# Patient Record
Sex: Male | Born: 1940 | Race: White | Hispanic: No | Marital: Married | Smoking: Never smoker
Health system: Southern US, Community
[De-identification: ages and names within clinical notes are randomized; demographics above are authoritative.]

## PROBLEM LIST (undated history)

## (undated) DIAGNOSIS — E785 Hyperlipidemia, unspecified: Secondary | ICD-10-CM

## (undated) DIAGNOSIS — I2510 Atherosclerotic heart disease of native coronary artery without angina pectoris: Secondary | ICD-10-CM

## (undated) DIAGNOSIS — I519 Heart disease, unspecified: Secondary | ICD-10-CM

## (undated) DIAGNOSIS — I4891 Unspecified atrial fibrillation: Secondary | ICD-10-CM

## (undated) DIAGNOSIS — K219 Gastro-esophageal reflux disease without esophagitis: Secondary | ICD-10-CM

## (undated) DIAGNOSIS — J45909 Unspecified asthma, uncomplicated: Secondary | ICD-10-CM

## (undated) DIAGNOSIS — D649 Anemia, unspecified: Secondary | ICD-10-CM

## (undated) DIAGNOSIS — E119 Type 2 diabetes mellitus without complications: Secondary | ICD-10-CM

## (undated) DIAGNOSIS — J449 Chronic obstructive pulmonary disease, unspecified: Secondary | ICD-10-CM

## (undated) HISTORY — DX: Hyperlipidemia, unspecified: E78.5

## (undated) HISTORY — PX: CORONARY ARTERY BYPASS GRAFT: SHX141

## (undated) HISTORY — DX: Gastro-esophageal reflux disease without esophagitis: K21.9

## (undated) HISTORY — DX: Chronic obstructive pulmonary disease, unspecified: J44.9

## (undated) HISTORY — DX: Heart disease, unspecified: I51.9

## (undated) HISTORY — DX: Type 2 diabetes mellitus without complications: E11.9

## (undated) HISTORY — DX: Unspecified asthma, uncomplicated: J45.909

## (undated) HISTORY — DX: Anemia, unspecified: D64.9

## (undated) HISTORY — PX: CARDIAC CATHETERIZATION: SHX172

## (undated) HISTORY — DX: Unspecified atrial fibrillation: I48.91

## (undated) HISTORY — DX: Atherosclerotic heart disease of native coronary artery without angina pectoris: I25.10

## (undated) HISTORY — PX: NECK SURGERY: SHX720

## (undated) HISTORY — PX: WRIST FRACTURE SURGERY: SHX121

---

## 2008-03-07 HISTORY — PX: REPLACEMENT TOTAL HIP W/  RESURFACING IMPLANTS: SUR1222

## 2012-07-02 DIAGNOSIS — J45909 Unspecified asthma, uncomplicated: Secondary | ICD-10-CM | POA: Insufficient documentation

## 2012-07-02 DIAGNOSIS — E291 Testicular hypofunction: Secondary | ICD-10-CM | POA: Insufficient documentation

## 2016-09-13 DIAGNOSIS — M1611 Unilateral primary osteoarthritis, right hip: Secondary | ICD-10-CM | POA: Insufficient documentation

## 2016-09-15 DIAGNOSIS — K920 Hematemesis: Secondary | ICD-10-CM | POA: Insufficient documentation

## 2017-04-10 DIAGNOSIS — N179 Acute kidney failure, unspecified: Secondary | ICD-10-CM | POA: Insufficient documentation

## 2017-04-10 DIAGNOSIS — J101 Influenza due to other identified influenza virus with other respiratory manifestations: Secondary | ICD-10-CM | POA: Insufficient documentation

## 2017-05-04 DIAGNOSIS — I255 Ischemic cardiomyopathy: Secondary | ICD-10-CM | POA: Insufficient documentation

## 2017-05-05 DIAGNOSIS — I5023 Acute on chronic systolic (congestive) heart failure: Secondary | ICD-10-CM | POA: Insufficient documentation

## 2017-11-15 DIAGNOSIS — T8189XA Other complications of procedures, not elsewhere classified, initial encounter: Secondary | ICD-10-CM | POA: Insufficient documentation

## 2017-12-14 DIAGNOSIS — Z951 Presence of aortocoronary bypass graft: Secondary | ICD-10-CM | POA: Insufficient documentation

## 2018-01-30 DIAGNOSIS — S21109A Unspecified open wound of unspecified front wall of thorax without penetration into thoracic cavity, initial encounter: Secondary | ICD-10-CM | POA: Insufficient documentation

## 2018-03-06 DIAGNOSIS — S21109A Unspecified open wound of unspecified front wall of thorax without penetration into thoracic cavity, initial encounter: Secondary | ICD-10-CM | POA: Insufficient documentation

## 2018-10-02 ENCOUNTER — Ambulatory Visit (INDEPENDENT_AMBULATORY_CARE_PROVIDER_SITE_OTHER): Payer: Self-pay | Admitting: Nurse Practitioner

## 2018-10-02 ENCOUNTER — Encounter: Payer: Self-pay | Admitting: Nurse Practitioner

## 2018-10-02 ENCOUNTER — Other Ambulatory Visit: Payer: Self-pay

## 2018-10-02 VITALS — BP 111/46 | HR 91 | Ht 72.5 in | Wt 204.0 lb

## 2018-10-02 DIAGNOSIS — R600 Localized edema: Secondary | ICD-10-CM

## 2018-10-02 DIAGNOSIS — J4520 Mild intermittent asthma, uncomplicated: Secondary | ICD-10-CM

## 2018-10-02 DIAGNOSIS — I2510 Atherosclerotic heart disease of native coronary artery without angina pectoris: Secondary | ICD-10-CM

## 2018-10-02 DIAGNOSIS — E11621 Type 2 diabetes mellitus with foot ulcer: Secondary | ICD-10-CM

## 2018-10-02 DIAGNOSIS — K219 Gastro-esophageal reflux disease without esophagitis: Secondary | ICD-10-CM

## 2018-10-02 DIAGNOSIS — E273 Drug-induced adrenocortical insufficiency: Secondary | ICD-10-CM

## 2018-10-02 DIAGNOSIS — Z7689 Persons encountering health services in other specified circumstances: Secondary | ICD-10-CM

## 2018-10-02 DIAGNOSIS — T380X5A Adverse effect of glucocorticoids and synthetic analogues, initial encounter: Secondary | ICD-10-CM

## 2018-10-02 DIAGNOSIS — L97509 Non-pressure chronic ulcer of other part of unspecified foot with unspecified severity: Secondary | ICD-10-CM

## 2018-10-02 DIAGNOSIS — J410 Simple chronic bronchitis: Secondary | ICD-10-CM

## 2018-10-02 DIAGNOSIS — L03031 Cellulitis of right toe: Secondary | ICD-10-CM

## 2018-10-02 DIAGNOSIS — B351 Tinea unguium: Secondary | ICD-10-CM

## 2018-10-02 DIAGNOSIS — E785 Hyperlipidemia, unspecified: Secondary | ICD-10-CM

## 2018-10-02 MED ORDER — MONTELUKAST SODIUM 10 MG PO TABS: 10.0000 mg | ORAL_TABLET | Freq: Every day | ORAL | 1 refills | Status: DC

## 2018-10-02 MED ORDER — PREDNISONE 10 MG PO TABS: 10.0000 mg | ORAL_TABLET | Freq: Every day | ORAL | 1 refills | Status: AC

## 2018-10-02 MED ORDER — ATORVASTATIN CALCIUM 80 MG PO TABS: 80.0000 mg | ORAL_TABLET | Freq: Every day | ORAL | 1 refills | Status: DC

## 2018-10-02 MED ORDER — SULFAMETHOXAZOLE-TRIMETHOPRIM 400-80 MG PO TABS: 1.0000 | ORAL_TABLET | Freq: Two times a day (BID) | ORAL | 0 refills | Status: AC

## 2018-10-02 MED ORDER — POTASSIUM CHLORIDE CRYS ER 20 MEQ PO TBCR: 40.0000 meq | EXTENDED_RELEASE_TABLET | Freq: Every day | ORAL | 1 refills | Status: DC

## 2018-10-02 MED ORDER — FUROSEMIDE 40 MG PO TABS: 40.0000 mg | ORAL_TABLET | Freq: Every day | ORAL | 1 refills | Status: DC

## 2018-10-02 MED ORDER — ALBUTEROL SULFATE HFA 108 (90 BASE) MCG/ACT IN AERS: 2.0000 | INHALATION_SPRAY | Freq: Four times a day (QID) | RESPIRATORY_TRACT | 11 refills | Status: AC | PRN

## 2018-10-02 MED ORDER — GLIPIZIDE 5 MG PO TABS: 5.0000 mg | ORAL_TABLET | Freq: Two times a day (BID) | ORAL | 1 refills | Status: DC

## 2018-10-02 MED ORDER — PANTOPRAZOLE SODIUM 40 MG PO TBEC: 40.0000 mg | DELAYED_RELEASE_TABLET | Freq: Two times a day (BID) | ORAL | 1 refills | Status: DC

## 2018-10-02 MED ORDER — ONETOUCH DELICA PLUS LANCET30G MISC: 1.0000 | Freq: Two times a day (BID) | 1 refills | Status: AC

## 2018-10-02 MED ORDER — ONETOUCH VERIO VI STRP: ORAL_STRIP | 12 refills | Status: AC

## 2018-10-02 NOTE — Progress Notes (Signed)
Subjective:    Patient ID: Joshua Roth, male    DOB: 1940-04-27, 78 y.o.   MRN: [id number]  Joshua Roth is a 78 y.o. male presenting on 10/02/2018 for Establish Care (possible cellulitis in the Rt lower leg w/ tingling feeling, redness and swelling)   HPI Establish Care New Provider Pt last seen by PCP  Joshua Roth in [place].  He worked at [employer] years recently retired. Kept only Medicare part A.  He finally added Medicare B, hopes to add Medicare part D. He has not yet heard back about his Medicare part B.  Cellulitis Right lower leg has had tingling, redness, swelling for the last 10-14 days.  He reports that he also has associated right paresthesias with neuropathy (burning, electrical shock pain).  He states similar symptoms occurred on left leg in past when he noted a puddle of water on the floor was noted prior wound on LEFT leg.  Then RIGHT leg got very swollen with very taut skin.  He has not had weeping from right leg yet.   Brief history review with patient   Diagnosis Date Noted  . 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  He has been out of his steroid for several days.  Starting to note fatigue.  He takes prednisone daily for last 15-20 years.  10/05/2018  . Type 2 diabetes mellitus with foot ulcer, without Joshua-term current use of insulin ([place]) See wound note above  10/05/2018  . 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   Takes Xarelto but is not sure why other than past CAD.  He has no history of clots or irregular heart rate.  He has been off this medication for several weeks.  Samples were provided to him by his past provider.  10/05/2018  . Simple chronic bronchitis ([facility]) Managed  on maintenance inhalers.  Needs refills.  No significant shortness of breath  10/05/2018  . Mild intermittent asthma without complication Managed on maintenance inhalers.  No significant shortness of breath  10/05/2018  . Onychomycosis Chronic, not currently treating.  Notes his wife provides foot care as he cannot see the bottom of his feet  10/05/2018  . Dyslipidemia Currently managed with statin.  No side effects noted.  No recent ASCVD events and no current symptoms.  10/05/2018  . Gastroesophageal reflux disease Currently managed with ppi.  No side effects noted, no bleeding.  10/05/2018    Past Medical History:  Diagnosis Date  . Anemia   . Asthma   . COPD (chronic obstructive pulmonary disease) ([place])   . Diabetes mellitus without complication ([place])   . GERD (gastroesophageal reflux disease)   . Heart disease   . Hyperlipidemia    Past Surgical History:  Procedure Laterality Date  . 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 Marital status: Married    Spouse name: Not on file  . Number of children: Not on file  . Years of education: Not on file  . Highest education level: Not on file  Occupational History  . Not on file  Social Needs  . Financial resource strain: Not on file  . Food insecurity    Worry: Not  on file    Inability: Not on file  . Transportation needs    Medical: Not on file    Non-medical: Not on file  Tobacco Use  . Smoking status: Never Smoker  . Smokeless tobacco: Never Used  Substance and Sexual Activity  . Alcohol use: Yes    Alcohol/week: 2.0 standard drinks    Types: 2 Cans of beer per week  . Drug use: Never  . Sexual activity: Not on file  Lifestyle  . Physical activity    Days per week: Not on file    Minutes per session: Not on file  . Stress: Not on file  Relationships  . Social Herbalist on phone: Not on file    Gets together: Not on file     Attends religious service: Not on file    Active member of club or organization: Not on file    Attends meetings of clubs or organizations: Not on file    Relationship status: Not on file  . Intimate partner violence    Fear of current or ex partner: Not on file    Emotionally abused: Not on file    Physically abused: Not on file    Forced sexual activity: Not on file  Other Topics Concern  . Not on file  Social History Narrative  . Not on file   Family History  Adopted: Yes   Current Outpatient Medications on File Prior to Visit  Medication Sig  . diclofenac sodium (VOLTAREN) 1 % GEL Apply 2 g topically as needed.  . ferrous sulfate 325 (65 FE) MG EC tablet Take 325 mg by mouth daily.  . Fluticasone-Salmeterol (WIXELA INHUB) 500-50 MCG/DOSE AEPB Inhale 1 puff into the lungs 2 (two) times daily.  . Iron-Vitamins (GERITOL PO) Take by mouth.  . nitroGLYCERIN (NITROSTAT) 0.4 MG SL tablet Place 0.4 mg under the tongue every 5 (five) minutes as needed for chest pain.  . rivaroxaban (XARELTO) 20 MG TABS tablet Take 20 mg by mouth daily with supper.   No current facility-administered medications on file prior to visit.     Review of Systems  Constitutional: Negative for activity change, appetite change, fatigue and unexpected weight change.  HENT: Negative for congestion, hearing loss and trouble swallowing.   Eyes: Negative for visual disturbance.  Respiratory: Positive for shortness of breath and wheezing. Negative for choking.   Cardiovascular: Positive for leg swelling. Negative for chest pain and palpitations.  Gastrointestinal: Negative for abdominal pain, blood in stool, constipation and diarrhea.  Genitourinary: Negative for difficulty urinating, discharge, flank pain, genital sores, penile pain, penile swelling, scrotal swelling and testicular pain.  Musculoskeletal: Negative for arthralgias, back pain and myalgias.  Skin: Positive for rash and wound. Negative for color  change.  Allergic/Immunologic: Negative for environmental allergies.  Neurological: Negative for dizziness, seizures, weakness and headaches.  Psychiatric/Behavioral: Negative for behavioral problems, decreased concentration, dysphoric mood, sleep disturbance and suicidal ideas. The patient is not nervous/anxious.    Per HPI unless specifically indicated above     Objective:    BP (!) 111/46 (BP Location: Left Arm, Patient Position: Sitting, Cuff Size: Normal)   Pulse 91   Ht 6' 0.5" (1.842 m)   Wt 204 lb (92.5 kg)   BMI 27.29 kg/m   Wt Readings from Last 3 Encounters:  10/02/18 204 lb (92.5 kg)    Physical Exam Vitals signs reviewed.  Constitutional:      General: He is not in acute  distress.    Appearance: He is well-developed.  HENT:     Head: Normocephalic and atraumatic.  Cardiovascular:     Rate and Rhythm: Normal rate and regular rhythm.     Pulses:          Radial pulses are 2+ on the right side and 2+ on the left side.       Posterior tibial pulses are 1+ on the right side and 1+ on the left side.     Heart sounds: Normal heart sounds, S1 normal and S2 normal.  Pulmonary:     Effort: Pulmonary effort is normal. No respiratory distress.     Breath sounds: Normal breath sounds and air entry.  Musculoskeletal:     Right lower leg: No edema.     Left lower leg: No edema.       Feet:  Skin:    General: Skin is warm and dry.     Capillary Refill: Capillary refill takes less than 2 seconds.  Neurological:     General: No focal deficit present.     Mental Status: He is alert and oriented to person, place, and time.  Psychiatric:        Attention and Perception: Attention normal.        Mood and Affect: Mood and affect normal.        Behavior: Behavior normal. Behavior is cooperative.        Thought Content: Thought content normal.        Judgment: Judgment normal.         Assessment & Plan:   Problem List Items Addressed This Visit      Cardiovascular and  Mediastinum   Coronary artery disease involving native coronary artery of native heart without angina pectoris Status unknown.  Continue medications at this time.  FOLLOW-UP in more detail in 6-8 weeks.   Relevant Medications   nitroGLYCERIN (NITROSTAT) 0.4 MG SL tablet   rivaroxaban (XARELTO) 20 MG TABS tablet   atorvastatin (LIPITOR) 80 MG tablet   furosemide (LASIX) 40 MG tablet     Respiratory   Simple chronic bronchitis (HCC) Lung function status unknown. Currently without exacerbation.  Continue medications at this time.  FOLLOW-UP in more detail in 6-8 weeks.   Relevant Medications   albuterol (VENTOLIN HFA) 108 (90 Base) MCG/ACT inhaler   Mild intermittent asthma without complication See COPD above   Relevant Medications   Fluticasone-Salmeterol (WIXELA INHUB) 500-50 MCG/DOSE AEPB   albuterol (VENTOLIN HFA) 108 (90 Base) MCG/ACT inhaler   montelukast (SINGULAIR) 10 MG tablet   predniSONE (DELTASONE) 10 MG tablet     Digestive   Gastroesophageal reflux disease Status unknown.  Continue medications at this time.  FOLLOW-UP in more detail in 6-8 weeks.   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 Need to obtain medical records for better history.  Continue medications at this time.  FOLLOW-UP in more detail in 6-8 weeks.   Relevant Medications   predniSONE (DELTASONE) 10 MG tablet   Type 2 diabetes mellitus with foot ulcer, without Joshua-term current use of insulin (HCC) Currently active ulcer.  DM status unknown.  Continue current meds - get labs and evaluate today.  Follow-up 6-8 weeks.   Relevant Medications   atorvastatin (LIPITOR) 80 MG tablet   glipiZIDE (GLUCOTROL) 5 MG tablet   glucose blood (ONETOUCH VERIO) test strip   Lancets [facility]  DELICA PLUS [id number]) MISC     Musculoskeletal and Integument   Onychomycosis Present on all nails of feet.  No  current action needed other than using athlete's foot OTC spray/cream, but recommend referral to podiatry.      Other   Pedal edema See cellulitis.  Patient regularly takes furosemide for fluid management.  Continue today. Check labs. Follow-up 3 months.   Relevant Medications   furosemide (LASIX) 40 MG tablet   potassium chloride SA (K-DUR) 20 MEQ tablet   Cellulitis of toe of right foot - Primary Patient with cellulitis of 3rd toe on right foot with involvement of lower RIGHT leg.  Toe wound is likely source of infection.  Needs referral to podiatry.  START Bactrim course x 14 days.  Call if side effects occur.  Keep regular foot care.  Follow-up 2-4 weeks prn.  Also adding CCM referral for assistance with medicare applications as this problem needs to be prevented with regular care in future.    Relevant Medications   sulfamethoxazole-trimethoprim (BACTRIM) 400-80 MG tablet   Dyslipidemia Status unknown.  Continue medications at this time.  FOLLOW-UP in more detail in 6-8 weeks.   Relevant Medications   atorvastatin (LIPITOR) 80 MG tablet    Other Visit Diagnoses    Encounter to establish care     Patient with complex medical history and poor ability to provide details today.  Will need to request records from past provider.  Reviewed history today as above.      Meds ordered this encounter  Medications  . albuterol (VENTOLIN HFA) 108 (90 Base) MCG/ACT inhaler    Sig: Inhale 2 puffs into the lungs every 6 (six) hours as needed for wheezing or shortness of breath.    Dispense:  8 g    Refill:  11    Order Specific Question:   Supervising Provider    Answer:   Olin Hauser [[id number]]  . atorvastatin (LIPITOR) 80 MG tablet    Sig: Take 1 tablet (80 mg total) by mouth daily.    Dispense:  90 tablet    Refill:  1    Order Specific Question:   Supervising Provider    Answer:   Olin Hauser [[id number]]  . furosemide (LASIX) 40 MG tablet    Sig: Take 1 tablet (40 mg  total) by mouth daily.    Dispense:  90 tablet    Refill:  1    Order Specific Question:   Supervising Provider    Answer:   Olin Hauser [[id number]]  . glipiZIDE (GLUCOTROL) 5 MG tablet    Sig: Take 1 tablet (5 mg total) by mouth 2 (two) times daily before a meal.    Dispense:  90 tablet    Refill:  1    Order Specific Question:   Supervising Provider    Answer:   Olin Hauser [[id number]]  . glucose blood (ONETOUCH VERIO) test strip    Sig: Use as instructed    Dispense:  100 each    Refill:  12    Hold prescription on file and fill when requested    Order Specific Question:   Supervising Provider    Answer:   Olin Hauser [[id number]]  . Lancets (ONETOUCH DELICA PLUS [id number]) MISC    Sig: 1 Device by Does not apply route 2 (two) times a day.    Dispense:  200 each    Refill:  1    Hold prescription on file  and fill when requested    Order Specific Question:   Supervising Provider    Answer:   Olin Hauser [[id number]]  . montelukast (SINGULAIR) 10 MG tablet    Sig: Take 1 tablet (10 mg total) by mouth at bedtime.    Dispense:  90 tablet    Refill:  1    Order Specific Question:   Supervising Provider    Answer:   Olin Hauser [[id number]]  . pantoprazole (PROTONIX) 40 MG tablet    Sig: Take 1 tablet (40 mg total) by mouth 2 (two) times daily.    Dispense:  90 tablet    Refill:  1    Order Specific Question:   Supervising Provider    Answer:   Olin Hauser [[id number]]  . potassium chloride SA (K-DUR) 20 MEQ tablet    Sig: Take 2 tablets (40 mEq total) by mouth daily.    Dispense:  180 tablet    Refill:  1    Order Specific Question:   Supervising Provider    Answer:   Olin Hauser [[id number]]  . predniSONE (DELTASONE) 10 MG tablet    Sig: Take 1 tablet (10 mg total) by mouth daily with breakfast.    Dispense:  180 tablet    Refill:  1    Order Specific Question:   Supervising Provider    Answer:   Olin Hauser [[id number]]  . sulfamethoxazole-trimethoprim (BACTRIM) 400-80 MG tablet    Sig: Take 1 tablet by mouth 2 (two) times daily for 14 days.    Dispense:  28 tablet    Refill:  0    Order Specific Question:   Supervising Provider    Answer:   Olin Hauser [[id number]]   Follow up plan: Return in about 2 weeks (around 10/16/2018) for telephone visit for cellulitis AND in 3 months for diabetes, COPD.  [name], DNP, AGPCNP-BC Adult Gerontology Primary Care Nurse Practitioner [facility] 10/02/2018, 10:57 AM

## 2018-10-02 NOTE — Patient Instructions (Addendum)
Joshua Roth,   Thank you for coming in to clinic today.  1. Continue all current medications without changes.  2. Your Right leg has cellulitis because of your Right third toe infection. START Bactrim 400-80 mg one tablet twice daily for 14 days.  3. You also have a fungus on your skin on the Right leg. - Start athlete's foot spray (Lotrimin or similar medicine) or cream twice daily until this is gone.    4. Podiatry referral is being made for foot care.  They will call you to make the appointment. [facility] [address] [place], [place] [address] Phone: [phone] Fax: [phone]  Please schedule a follow-up appointment with [name], AGNP. Return in about 2 weeks (around 10/16/2018) for telephone visit for cellulitis AND in 3 months for diabetes, COPD.  If you have any other questions or concerns, please feel free to call the clinic or send a message through [place]. You may also schedule an earlier appointment if necessary.  You will receive a survey after today's visit either digitally by e-mail or paper by [employer]. Your experiences and feedback matter to [place].  Please respond so we know how we are doing as we provide care for you.   [name], DNP, AGNP-BC Adult Gerontology Nurse Practitioner [place]

## 2018-10-05 ENCOUNTER — Encounter: Payer: Self-pay | Admitting: Nurse Practitioner

## 2018-10-05 DIAGNOSIS — J410 Simple chronic bronchitis: Secondary | ICD-10-CM | POA: Insufficient documentation

## 2018-10-05 DIAGNOSIS — I2510 Atherosclerotic heart disease of native coronary artery without angina pectoris: Secondary | ICD-10-CM | POA: Insufficient documentation

## 2018-10-05 DIAGNOSIS — K219 Gastro-esophageal reflux disease without esophagitis: Secondary | ICD-10-CM | POA: Insufficient documentation

## 2018-10-05 DIAGNOSIS — J4520 Mild intermittent asthma, uncomplicated: Secondary | ICD-10-CM | POA: Insufficient documentation

## 2018-10-05 DIAGNOSIS — E785 Hyperlipidemia, unspecified: Secondary | ICD-10-CM | POA: Insufficient documentation

## 2018-10-05 DIAGNOSIS — E11621 Type 2 diabetes mellitus with foot ulcer: Secondary | ICD-10-CM | POA: Insufficient documentation

## 2018-10-05 DIAGNOSIS — E273 Drug-induced adrenocortical insufficiency: Secondary | ICD-10-CM | POA: Insufficient documentation

## 2018-10-05 DIAGNOSIS — B351 Tinea unguium: Secondary | ICD-10-CM | POA: Insufficient documentation

## 2018-10-05 DIAGNOSIS — R600 Localized edema: Secondary | ICD-10-CM | POA: Insufficient documentation

## 2018-10-05 DIAGNOSIS — T380X5A Adverse effect of glucocorticoids and synthetic analogues, initial encounter: Secondary | ICD-10-CM | POA: Insufficient documentation

## 2018-10-05 DIAGNOSIS — L03031 Cellulitis of right toe: Secondary | ICD-10-CM | POA: Insufficient documentation

## 2018-10-05 DIAGNOSIS — L97509 Non-pressure chronic ulcer of other part of unspecified foot with unspecified severity: Secondary | ICD-10-CM | POA: Insufficient documentation

## 2018-10-16 ENCOUNTER — Ambulatory Visit (INDEPENDENT_AMBULATORY_CARE_PROVIDER_SITE_OTHER): Payer: Self-pay | Admitting: Nurse Practitioner

## 2018-10-16 ENCOUNTER — Other Ambulatory Visit: Payer: Self-pay

## 2018-10-16 ENCOUNTER — Encounter: Payer: Self-pay | Admitting: Nurse Practitioner

## 2018-10-16 DIAGNOSIS — L03031 Cellulitis of right toe: Secondary | ICD-10-CM

## 2018-10-16 NOTE — Progress Notes (Signed)
   Telemedicine Encounter: Disclosed to patient at start of encounter that we will provide appropriate telemedicine services.  Patient consents to be treated via phone prior to discussion. - Patient is at his home and is accessed via telephone. - Services are provided by [name] from [facility].   Subjective:    Patient ID: Joshua Roth, male    DOB: 08/27/1940, 78 y.o.   MRN: [id number]  Joshua Roth is a 78 y.o. male presenting on 10/16/2018 for Recurrent Skin Infections (pt finished abx today and notice improvement with swelling, but unsure if its related to abx or just him elevation them. He still notice bilateral erythema . )  HPI Cellulitis follow-up Patient notes he took the last pill of his Bactrim today.  Patient still has redness from ankle and "14-15 inches up leg."  Swelling is improving.  More swelling again with dependent positions. Will continue to get hardened and tight with swelling as the day progresses.  - Patient is unable to see the bottom of his foot, but has been using a mirror.  His wife helps him care for his feet. - She describes the 3rd toe today as continuing to be hard and crusty, Is still dark brown central wound with a little red around the edges.  Toe swelling and redness has improved some. - Patient does have approval for Part B effective in July 2020.  Social History   Tobacco Use  . Smoking status: Never Smoker  . Smokeless tobacco: Never Used  Substance Use Topics  . Alcohol use: Yes    Alcohol/week: 2.0 standard drinks    Types: 2 Cans of beer per week  . Drug use: Never    Review of Systems Per HPI unless specifically indicated above     Objective:    There were no vitals taken for this visit.  Wt Readings from Last 3 Encounters:  10/02/18 204 lb (92.5 kg)    Physical Exam Patient remotely monitored.  Verbal communication appropriate.  Cognition normal.  Verbal description of wound sounds relatively unchanged  from initial exam. No results found for this or any previous visit.    Assessment & Plan:   Problem List Items Addressed This Visit      Other   Cellulitis of toe of right foot - Primary   Relevant Orders   Ambulatory referral to Wound Clinic    Remote evaluation limits assessment of wound, but description indicates limited improvement.  Patient's wound healing complicated by uncontrolled T2DM, likely poor circulation.  Plan: 1. Referral to wound clinic.   2. No repeat abx course at this time.   3. Reviewed signs and symptoms of worsening wound infection and when to call clinic.  Patient and his wife verbalize understanding. 4. Follow-up 2-4 weeks prn.  - Time spent in direct consultation with patient via telemedicine about above concerns: 7 minutes  Follow up plan: Follow-up 2-4 weeks prn and in about 1 month for DM assessment.  [name], DNP, AGPCNP-BC Adult Gerontology Primary Care Nurse Practitioner [facility] 10/16/2018, 11:14 AM

## 2018-10-18 ENCOUNTER — Other Ambulatory Visit: Payer: Self-pay

## 2018-10-18 ENCOUNTER — Encounter: Payer: Medicare Other | Attending: Physician Assistant | Admitting: Physician Assistant

## 2018-10-18 ENCOUNTER — Encounter: Payer: Self-pay | Admitting: Nurse Practitioner

## 2018-10-18 DIAGNOSIS — I252 Old myocardial infarction: Secondary | ICD-10-CM | POA: Insufficient documentation

## 2018-10-18 DIAGNOSIS — I482 Chronic atrial fibrillation, unspecified: Secondary | ICD-10-CM | POA: Insufficient documentation

## 2018-10-18 DIAGNOSIS — E1140 Type 2 diabetes mellitus with diabetic neuropathy, unspecified: Secondary | ICD-10-CM | POA: Insufficient documentation

## 2018-10-18 DIAGNOSIS — I2510 Atherosclerotic heart disease of native coronary artery without angina pectoris: Secondary | ICD-10-CM | POA: Diagnosis not present

## 2018-10-18 DIAGNOSIS — Z7984 Long term (current) use of oral hypoglycemic drugs: Secondary | ICD-10-CM | POA: Insufficient documentation

## 2018-10-18 DIAGNOSIS — L84 Corns and callosities: Secondary | ICD-10-CM | POA: Diagnosis not present

## 2018-10-18 DIAGNOSIS — I10 Essential (primary) hypertension: Secondary | ICD-10-CM | POA: Diagnosis not present

## 2018-10-18 DIAGNOSIS — E119 Type 2 diabetes mellitus without complications: Secondary | ICD-10-CM | POA: Insufficient documentation

## 2018-10-18 DIAGNOSIS — Z7901 Long term (current) use of anticoagulants: Secondary | ICD-10-CM | POA: Insufficient documentation

## 2018-10-18 NOTE — Progress Notes (Signed)
AZTLAN, COLL ([id number]) Visit Report for 10/18/2018 Abuse/Suicide Risk Screen Details Patient Name: Joshua Roth, Joshua Roth Date of Service: 10/18/2018 2:15 PM Medical Record Number: [id number] Patient Account Number: [id number] Date of Birth/Sex: 08-Oct-1940 (78 y.o. M) Treating RN: Montey Hora Primary Care Ghassan Coggeshall: [name] Other Clinician: Referring Awilda Covin: [name] Treating Athalene Kolle/Extender: Melburn Hake, HOYT Weeks in Treatment: 0 Abuse/Suicide Risk Screen Items Answer ABUSE RISK SCREEN: Has anyone close to you tried to hurt or harm you recentlyo No Do you feel uncomfortable with anyone in your familyo No Has anyone forced you do things that you didnot want to doo No Electronic Signature(s) Signed: 10/18/2018 4:45:21 PM By: Montey Hora Entered By: Montey Hora on 10/18/2018 14:51:34 Joshua Roth ([id number]) -------------------------------------------------------------------------------- Activities of Daily Living Details Patient Name: Joshua Roth Date of Service: 10/18/2018 2:15 PM Medical Record Number: [id number] Patient Account Number: [id number] Date of Birth/Sex: September 19, 1940 (78 y.o. M) Treating RN: Montey Hora Primary Care Mehkai Gallo: [name] Other Clinician: Referring Jerilynn Feldmeier: [name] Treating Chaise Mahabir/Extender: Melburn Hake, HOYT Weeks in Treatment: 0 Activities of Daily Living Items Answer Activities of Daily Living (Please select one for each item) Drive Automobile Completely Able Take Medications Completely Able Use Telephone Completely Able Care for Appearance Completely Able Use Toilet Completely Able Bath / Shower Completely Able Dress Self Completely Able Feed Self Completely Able Walk Completely Able Get In / Out Bed Completely Able Housework Completely Able Prepare Meals Completely [place] for Self Completely Able Electronic Signature(s) Signed: 10/18/2018 4:45:21 PM By: Montey Hora Entered By: Montey Hora on 10/18/2018 14:51:54 Joshua Roth ([id number]) -------------------------------------------------------------------------------- Education Screening Details Patient Name: Joshua Roth Date of Service: 10/18/2018 2:15 PM Medical Record Number: [id number] Patient Account Number: [id number] Date of Birth/Sex: [birth year] (78 y.o. M) Treating RN: Montey Hora Primary Care Mehkai Gallo: [name] Other Clinician: Referring Tamma Brigandi: [name] Treating Knox Holdman/Extender: Melburn Hake, HOYT Weeks in Treatment: 0 Primary Learner Assessed: Patient Learning Preferences/Education Level/Primary Language Learning Preference: Explanation, Demonstration Highest Education Level: College or Above Preferred Language: English Cognitive Barrier Language Barrier: No Translator Needed: No Memory Deficit: No Emotional Barrier: No Cultural/Religious Beliefs Affecting Medical Care: No Physical Barrier Impaired Vision: No Impaired Hearing: No Decreased Hand dexterity: No Knowledge/Comprehension Knowledge Level: Medium Comprehension Level: Medium Ability to understand written Medium instructions: Ability to understand verbal Medium instructions: Motivation Anxiety Level: Calm Cooperation: Cooperative Education Importance: Acknowledges Need Interest in Health Problems: Asks Questions Perception: Coherent Willingness to Engage in Self- Medium Management Activities: Readiness to Engage in Self- Medium Management Activities: Electronic Signature(s) Signed: 10/18/2018 4:45:21 PM By: Montey Hora Entered By: Montey Hora on 10/18/2018 14:52:21 Joshua Roth ([id number]) -------------------------------------------------------------------------------- Fall Risk Assessment Details Patient Name: Joshua Roth Date of Service: 10/18/2018 2:15 PM Medical Record Number: [id number] Patient Account Number: [id number] Date of Birth/Sex: [birth year]  (78 y.o. M) Treating RN: Montey Hora Primary Care Scarlet Abad: [name] Other Clinician: Referring Leathia Farnell: [name] Treating Guelda Batson/Extender: Melburn Hake, HOYT Weeks in Treatment: 0 Fall Risk Assessment Items Have you had 2 or more falls in the last 12 monthso 0 No Have you had any fall that resulted in injury in the last 12 monthso 0 No FALLS RISK SCREEN History of falling - immediate or within 3 months 0 No Secondary diagnosis (Do you have 2 or more medical diagnoseso) 0 No Ambulatory aid None/bed rest/wheelchair/nurse 0 Yes Crutches/cane/walker 0 No Furniture 0 No Intravenous therapy Access/Saline/Heparin Lock 0 No Gait/Transferring Normal/ bed rest/ wheelchair 0 Yes Weak (short steps with or without shuffle, stooped  but able to lift head while 0 No walking, may seek support from furniture) Impaired (short steps with shuffle, may have difficulty arising from chair, head 0 No down, impaired balance) Mental Status Oriented to own ability 0 Yes Notes had a fall 7 months ago Electronic Signature(s) Signed: 10/18/2018 4:45:21 PM By: Montey Hora Entered By: Montey Hora on 10/18/2018 14:53:40 Joshua Roth ([id number]) -------------------------------------------------------------------------------- Foot Assessment Details Patient Name: Joshua Roth Date of Service: 10/18/2018 2:15 PM Medical Record Number: [id number] Patient Account Number: [id number] Date of Birth/Sex: [birth year] (78 y.o. M) Treating RN: Montey Hora Primary Care Thelmer Legler: [name] Other Clinician: Referring Marlies Ligman: [name] Treating Chaselyn Nanney/Extender: Melburn Hake, HOYT Weeks in Treatment: 0 Foot Assessment Items Site Locations + = Sensation present, - = Sensation absent, C = Callus, U = Ulcer R = Redness, W = Warmth, M = Maceration, PU = Pre-ulcerative lesion F = Fissure, S = Swelling, D = Dryness Assessment Right: Left: Other Deformity: No No Prior Foot  Ulcer: No No Prior Amputation: No No Charcot Joint: No No Ambulatory Status: Ambulatory Without Help Gait: Steady Electronic Signature(s) Signed: 10/18/2018 4:45:21 PM By: Montey Hora Entered By: Montey Hora on 10/18/2018 14:56:45 Joshua Roth ([id number]) -------------------------------------------------------------------------------- Nutrition Risk Screening Details Patient Name: Joshua Roth Date of Service: 10/18/2018 2:15 PM Medical Record Number: [id number] Patient Account Number: [id number] Date of Birth/Sex: [birth year] (78 y.o. M) Treating RN: Montey Hora Primary Care Tene Gato: [name] Other Clinician: Referring Paris Hohn: [name] Treating Wilho Sharpley/Extender: Melburn Hake, HOYT Weeks in Treatment: 0 Height (in): 73 Weight (lbs): 205 Body Mass Index (BMI): 27 Nutrition Risk Screening Items Score Screening NUTRITION RISK SCREEN: I have an illness or condition that made me change the kind and/or amount of 0 No food I eat I eat fewer than two meals per day 0 No I eat few fruits and vegetables, or milk products 0 No I have three or more drinks of beer, liquor or wine almost every day 0 No I have tooth or mouth problems that make it hard for me to eat 0 No I don't always have enough money to buy the food I need 0 No I eat alone most of the time 0 No I take three or more different prescribed or over-the-counter drugs a day 1 Yes Without wanting to, I have lost or gained 10 pounds in the last six months 0 No I am not always physically able to shop, cook and/or feed myself 0 No Nutrition Protocols Good Risk Protocol 0 No interventions needed Moderate Risk Protocol High Risk Proctocol Risk Level: Good Risk Score: 1 Electronic Signature(s) Signed: 10/18/2018 4:45:21 PM By: Montey Hora Entered By: Montey Hora on 10/18/2018 14:53:47

## 2018-10-18 NOTE — Progress Notes (Addendum)
JONES, VIVIANI ([id number]) Visit Report for 10/18/2018 Allergy List Details Patient Name: MELTON, WALLS Date of Service: 10/18/2018 2:15 PM Medical Record Number: [id number] Patient Account Number: [id number] Date of Birth/Sex: August 09, 1940 (78 y.o. M) Treating RN: Montey Hora Primary Care Bilaal Leib: [name] Other Clinician: Referring Jakson Delpilar: [name] Treating Jerilee Space/Extender: STONE III, HOYT Weeks in Treatment: 0 Allergies Active Allergies No Known Drug Allergies Allergy Notes Electronic Signature(s) Signed: 10/18/2018 4:45:21 PM By: Montey Hora Entered By: Montey Hora on 10/18/2018 14:47:15 [name] ([id number]) -------------------------------------------------------------------------------- Arrival Information Details Patient Name: [name] Date of Service: 10/18/2018 2:15 PM Medical Record Number: [id number] Patient Account Number: [id number] Date of Birth/Sex: 12/05/1940 (78 y.o. 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 M) Treating RN: Army Melia Primary Care Harjit Douds: [name] Other  Clinician: Referring Shakeya Kerkman: [name] Treating Minka Knight/Extender: Melburn Hake, HOYT Weeks in Treatment: 0 Clinic Level of Care Assessment Items TOOL 4 Quantity Score []  - Use when only an EandM is performed on FOLLOW-UP visit 0 ASSESSMENTS - Nursing Assessment / Reassessment X - Reassessment of Co-morbidities (includes updates in patient status) 1 10 X- 1 5 Reassessment of Adherence to Treatment Plan ASSESSMENTS - Wound and Skin Assessment / Reassessment X - Simple Wound Assessment / Reassessment - one wound 1 5 []  - 0 Complex Wound Assessment / Reassessment - multiple wounds []  - 0 Dermatologic / Skin Assessment (not related to wound area) ASSESSMENTS - Focused Assessment []  - Circumferential Edema Measurements - multi extremities 0 []  - 0 Nutritional Assessment / Counseling / Intervention []  - 0 Lower Extremity Assessment (monofilament, tuning fork, pulses) []  - 0 Peripheral Arterial Disease Assessment (using hand held doppler) ASSESSMENTS - Ostomy and/or Continence Assessment and Care []  - Incontinence Assessment and Management 0 []  - 0 Ostomy Care Assessment and Management (repouching, etc.) PROCESS - Coordination of Care X - Simple Patient / Family Education for ongoing care 1 15 []  - 0 Complex (extensive) Patient / Family Education for ongoing care X- 1 10 Staff obtains Programmer, systems, Records, Test Results / Process Orders []  - 0 Staff telephones HHA, Nursing Homes / Clarify orders / etc []  - 0 Routine Transfer to another Facility (non-emergent condition) []  - 0 Routine Hospital Admission (non-emergent condition) X- 1 15 New Admissions / Biomedical engineer / Ordering NPWT, Apligraf, etc. []  - 0 Emergency Hospital Admission (emergent condition) X- 1 10 Simple Discharge Coordination OLAND, ARQUETTE ([id number]) []  - 0 Complex (extensive) Discharge Coordination PROCESS - Special Needs []  - Pediatric / Minor Patient Management 0 []  - 0 Isolation Patient  Management []  - 0 Hearing / Language / Visual special needs []  - 0 Assessment of Community assistance (transportation, D/C planning, etc.) []  - 0 Additional assistance / Altered mentation []  - 0 Support Surface(s) Assessment (bed, cushion, seat, etc.) INTERVENTIONS - Wound Cleansing / Measurement X - Simple Wound Cleansing - one wound 1 5 []  - 0 Complex Wound Cleansing - multiple  wounds []  - 0 Wound Imaging (photographs - any number of wounds) []  - 0 Wound Tracing (instead of photographs) []  - 0 Simple Wound Measurement - one wound []  - 0 Complex Wound Measurement - multiple wounds INTERVENTIONS - Wound Dressings []  - Small Wound Dressing one or multiple wounds 0 []  - 0 Medium Wound Dressing one or multiple wounds []  - 0 Large Wound Dressing one or multiple wounds []  - 0 Application of Medications - topical []  - 0 Application of Medications - injection INTERVENTIONS - Miscellaneous []  - External ear exam 0 []  - 0 Specimen Collection (cultures, biopsies, blood, body fluids, etc.) []  - 0 Specimen(s) / Culture(s) sent or taken to Lab for analysis []  - 0 Patient Transfer (multiple staff / Civil Service fast streamer / Similar devices) []  - 0 Simple Staple / Suture removal (25 or less) []  - 0 Complex Staple / Suture removal (26 or more) []  - 0 Hypo / Hyperglycemic Management (close monitor of Blood Glucose) []  - 0 Ankle / Brachial Index (ABI) - do not check if billed separately X- 1 5 Vital Signs Pardo, Benjimen ([id number]) Has the patient been seen at the hospital within the last three years: Yes Total Score: 80 Level Of Care: New/Established - Level 3 Electronic Signature(s) Signed: 10/18/2018 4:39:13 PM By: Army Melia Entered By: Army Melia on 10/18/2018 15:19:47 [name] ([id number]) -------------------------------------------------------------------------------- Encounter Discharge Information Details Patient Name: [name] Date of Service: 10/18/2018 2:15  PM Medical Record Number: [id number] Patient Account Number: [id number] Date of Birth/Sex: Jan 14, 1941 (78 y.o. M) Treating RN: Army Melia Primary Care Amantha Sklar: [name] Other Clinician: Referring Salimah Martinovich: [name] Treating Lisle Skillman/Extender: Melburn Hake, HOYT Weeks in Treatment: 0 Encounter Discharge Information Items Discharge Condition: Stable Ambulatory Status: Ambulatory Discharge Destination: Home Transportation: Private Auto Accompanied By: self Schedule Follow-up Appointment: Yes Clinical Summary of Care: Electronic Signature(s) Signed: 10/18/2018 4:39:13 PM By: Army Melia Entered By: Army Melia on 10/18/2018 15:20:06 [name] ([id number]) -------------------------------------------------------------------------------- Lower Extremity Assessment Details Patient Name: [name] Date of Service: 10/18/2018 2:15 PM Medical Record Number: [id number] Patient Account Number: [id number] Date of Birth/Sex: 27-May-1940 (77 y.o. M) Treating RN: Montey Hora Primary Care Domonic Hiscox: [name] Other Clinician: Referring Apurva Reily: [name] Treating Demarie Hyneman/Extender: Melburn Hake, HOYT Weeks in Treatment: 0 Edema Assessment Assessed: [Left: No] [Right: No] Edema: [Left: No] [Right: Yes] Vascular Assessment Pulses: Dorsalis Pedis Palpable: [Left:Yes] [Right:Yes] Doppler Audible: [Left:Yes] [Right:Yes] Posterior Tibial Palpable: [Left:Yes] [Right:Yes] Doppler Audible: [Left:Yes] [Right:Yes] Blood Pressure: Brachial: [Left:130] Dorsalis Pedis: 136 [Left:Dorsalis Pedis: 623] Ankle: Posterior Tibial: 132 [Left:Posterior Tibial: 128 1.05] [Right:1.09] Electronic Signature(s) Signed: 10/18/2018 4:45:21 PM By: Montey Hora Entered By: Montey Hora on 10/18/2018 15:06:28 [name] ([id number]) -------------------------------------------------------------------------------- Multi Wound Chart Details Patient Name: [name] Date of Service: 10/18/2018 2:15 PM Medical Record Number: [id number] Patient Account Number: [id number] Date of Birth/Sex: April 27, 1940 (77 y.o. M) Treating RN: Army Melia Primary Care Catcher Dehoyos: [name] Other Clinician: Referring Mckynleigh Mussell: [name] Treating Jayshun Galentine/Extender: Melburn Hake, HOYT Weeks in Treatment: 0 Vital Signs Height(in): 73 Pulse(bpm): 86 Weight(lbs): 205 Blood Pressure(mmHg): 130/84 Body Mass Index(BMI): 27 Temperature(F): 98.8 Respiratory Rate 16 (breaths/min): Wound Assessments Treatment Notes Electronic Signature(s) Signed: 10/18/2018 4:39:13 PM By: Army Melia Entered By: Army Melia on 10/18/2018 15:17:41 [name] ([id number]) -------------------------------------------------------------------------------- Multi-Disciplinary Care Plan Details Patient Name: [name] Date of Service: 10/18/2018 2:15 PM Medical Record Number: [id number] Patient Account Number: [id number] Date of Birth/Sex: 03-12-1940 (77 y.o. M) Treating RN: Army Melia Primary Care Kaisei Gilbo: [name] Other Clinician:  Referring Alexiah Koroma: [name] Treating Lealer Marsland/Extender: Melburn Hake, HOYT Weeks in Treatment: 0 Active Inactive Electronic Signature(s) Signed: 10/18/2018 4:39:13 PM By: Army Melia Entered By: Army Melia on 10/18/2018 15:17:33 [name] ([id number]) -------------------------------------------------------------------------------- Non-Wound Condition Assessment Details Patient Name: [name] Date of Service: 10/18/2018 2:15 PM Medical Record Number: [id number] Patient Account Number: [id number] Date of Birth/Sex: 13-Mar-1940 (77 y.o. M) Treating RN: Montey Hora Primary Care Kysa Calais: [name] Other Clinician: Referring Myrian Botello: [name] Treating Aranza Geddes/Extender: Melburn Hake, HOYT Weeks in Treatment: 0 Non-Wound Condition: Condition: Other Dermatologic Condition Location: Foot Side:  Right Photos Notes patient with a callus on his right 3rd toe Electronic Signature(s) Signed: 10/18/2018 4:45:21 PM By: Montey Hora Entered By: Montey Hora on 10/18/2018 14:58:38 [name] ([id number]) -------------------------------------------------------------------------------- Pain Assessment Details Patient Name: [name] Date of Service: 10/18/2018 2:15 PM Medical Record Number: [id number] Patient Account Number: [id number] Date of Birth/Sex: 1941/01/21 (77 y.o. M) Treating RN: Montey Hora Primary Care Kaylan Yates: [name] Other Clinician: Referring Titiana Severa: [name] Treating Jersey Ravenscroft/Extender: Melburn Hake, HOYT Weeks in Treatment: 0 Active Problems Location of Pain Severity and Description of Pain Patient Has Paino No Site Locations Pain Management and Medication Current Pain Management: Electronic Signature(s) Signed: 10/18/2018 4:45:21 PM By: Montey Hora Entered By: Montey Hora on 10/18/2018 14:40:43 [name] ([id number]) -------------------------------------------------------------------------------- Patient/Caregiver Education Details Patient Name: [name] Date of Service: 10/18/2018 2:15 PM Medical Record Number: [id number] Patient Account Number: [id number] Date of Birth/Gender: 13-May-1940 (77 y.o. M) Treating RN: Army Melia Primary Care Physician: [name] Other Clinician: Referring Physician: [name] Treating Physician/Extender: Sharalyn Ink in Treatment: 0 Education Assessment Education Provided To: Patient Education Topics Provided Wound/Skin Impairment: Handouts: Caring for Your Ulcer Methods: Demonstration, Explain/Verbal Responses: State content correctly Electronic Signature(s) Signed: 10/18/2018 4:39:13 PM By: Army Melia Entered By: Army Melia on 10/18/2018 15:18:55 [name]  ([id number]) -------------------------------------------------------------------------------- Vitals Details Patient Name: [name] Date of Service: 10/18/2018 2:15 PM Medical Record Number: [id number] Patient Account Number: [id number] Date of Birth/Sex: 1940-11-22 (77 y.o. M) Treating RN: Montey Hora Primary Care Franciscojavier Wronski: [name] Other Clinician: Referring Dryden Tapley: [name] Treating Shakisha Abend/Extender: Melburn Hake, HOYT Weeks in Treatment: 0 Vital Signs Time Taken: 14:40 Temperature (F): 98.8 Height (in): 73 Pulse (bpm): 86 Source: Measured Respiratory Rate (breaths/min): 16 Weight (lbs): 205 Blood Pressure (mmHg): 130/84 Source: Measured Reference Range: 80 - 120 mg / dl Body Mass Index (BMI): 27 Electronic Signature(s) Signed: 10/18/2018 4:45:21 PM By: Montey Hora Entered By: Montey Hora on 10/18/2018 14:42:38

## 2018-10-19 NOTE — Progress Notes (Addendum)
COLLEEN, DONAHOE ([id number]) Visit Report for 10/18/2018 Chief Complaint Document Details Patient Name: Joshua Roth, Joshua Roth Date of Service: 10/18/2018 2:15 PM Medical Record Number: [id number] Patient Account Number: [id number] Date of Birth/Sex: June 14, 1940 (78 y.o. M) Treating RN: Army Melia Primary Care Provider: [name] Other Clinician: Referring Provider: [name] Treating Provider/Extender: Melburn Hake, HOYT Weeks in Treatment: 0 Information Obtained from: Patient Chief Complaint Toe callus/cellulitis Electronic Signature(s) Signed: 10/18/2018 10:42:11 PM By: Worthy Keeler PA-C Entered By: Worthy Keeler on 10/18/2018 22:41:08 Joshua Roth ([id number]) -------------------------------------------------------------------------------- HPI Details Patient Name: Joshua Roth Date of Service: 10/18/2018 2:15 PM Medical Record Number: [id number] Patient Account Number: [id number] Date of Birth/Sex: August 10, 1940 (78 y.o. M) Treating RN: Army Melia Primary Care Provider: [name] Other Clinician: Referring Provider: [name] Treating Provider/Extender: Melburn Hake, HOYT Weeks in Treatment: 0 History of Present Illness HPI Description: 10/18/18 upon evaluation today patient presents for initial evaluation our clinic concerning issues he's been having with his right third toe. He tells me that he actually had an infection at the site he was seen by his family doctor in [place] prior to moving to [place]. Subsequently he tells me that his doctor actually placed him on an antibiotic for cellulitis he believes this may of been Keflex. With that being said he also wanted to follow up and be seen here which is why the patient has made the appointment to evaluate here with [place] today. Subsequently this is a patient who does have diabetes. He also has chronic a trophy relation, his own long-term anticoagulant therapy due to this, has had a recent heart attack  this seems to be doing well, and has hypertension. At this time the does not really appear to be open wound based on what I'm seeing at this point. He does have a bit of the callous at this site some of that pulled off just with gentle cleaning over the area by the nurse prior to becoming in. Subsequently when I came in they were still callous buildup around the tip of his toe during my evaluation. Electronic Signature(s) Signed: 10/20/2018 3:12:54 AM By: Worthy Keeler PA-C Entered By: Worthy Keeler on 10/20/2018 02:50:00 Joshua Roth ([id number]) -------------------------------------------------------------------------------- Callus Pairing Details Patient Name: Joshua Roth Date of Service: 10/18/2018 2:15 PM Medical Record Number: [id number] Patient Account Number: [id number] Date of Birth/Sex: Jul 28, 1940 (78 y.o. M) Treating RN: Army Melia Primary Care Provider: [name] Other Clinician: Referring Provider: [name] Treating Provider/Extender: Melburn Hake, HOYT Weeks in Treatment: 0 Procedure Performed for: NonWound Condition Other Dermatologic Condition - Right Foot Performed By: Physician Emilio Math., PA-C Post Procedure Diagnosis Same as Pre-procedure Electronic Signature(s) Signed: 10/18/2018 4:39:13 PM By: Army Melia Entered By: Army Melia on 10/18/2018 15:19:21 Joshua Roth ([id number]) -------------------------------------------------------------------------------- Physical Exam Details Patient Name: Joshua Roth Date of Service: 10/18/2018 2:15 PM Medical Record Number: [id number] Patient Account Number: [id number] Date of Birth/Sex: Dec 16, 1940 (78 y.o. M) Treating RN: Army Melia Primary Care Provider: [name] Other Clinician: Referring Provider: [name] Treating Provider/Extender: Melburn Hake, HOYT Weeks in Treatment: 0 Constitutional patient is hypertensive.. pulse regular and within target range for patient.[name]  respirations regular, non-labored and within target range for patient.[name] temperature within target range for patient.. Well-nourished and well-hydrated in no acute distress. Eyes conjunctiva clear no eyelid edema noted. pupils equal round and reactive to light and accommodation. Ears, Nose, Mouth, and Throat no gross abnormality of ear auricles or external auditory canals. normal hearing noted during conversation. mucus membranes  moist. Respiratory normal breathing without difficulty. clear to auscultation bilaterally. Cardiovascular regular rate and rhythm with normal S1, S2. 2+ dorsalis pedis/posterior tibialis pulses. no clubbing, cyanosis, significant edema, <3 sec cap refill. Gastrointestinal (GI) soft, non-tender, non-distended, +BS. no ventral hernia noted. Musculoskeletal normal gait and posture. no significant deformity or arthritic changes, no loss or range of motion, no clubbing. Psychiatric this patient is able to make decisions and demonstrates good insight into disease process. Alert and Oriented x 3. pleasant and cooperative. Notes Upon inspection today patient's wound actually appears to be completely healed. There was some callous noted at the site of this did require some pairing away which he tolerated with no pain and no bleeding. Overall I feel like he is doing quite well I see nothing that is gonna require ongoing wound care. Electronic Signature(s) Signed: 10/20/2018 3:12:54 AM By: Worthy Keeler PA-C Entered By: Worthy Keeler on 10/20/2018 02:55:23 Joshua Roth ([id number]) -------------------------------------------------------------------------------- Physician Orders Details Patient Name: Joshua Roth Date of Service: 10/18/2018 2:15 PM Medical Record Number: [id number] Patient Account Number: [id number] Date of Birth/Sex: 05-27-1940 (78 y.o. M) Treating RN: Army Melia Primary Care Provider: [name] Other Clinician: Referring Provider:  [name] Treating Provider/Extender: Melburn Hake, HOYT Weeks in Treatment: 0 Verbal / Phone Orders: No Diagnosis Coding ICD-10 Coding Code Description L84 Corns and callosities E11.622 Type 2 diabetes mellitus with other skin ulcer I48.20 Chronic atrial fibrillation, unspecified Z79.01 Long term (current) use of anticoagulants I25.10 Atherosclerotic heart disease of native coronary artery without angina pectoris I10 Essential (primary) hypertension Discharge From [facility] Services o Discharge from [facility] - no treatment needed Electronic Signature(s) Signed: 10/18/2018 4:39:13 PM By: Army Melia Signed: 10/18/2018 10:42:11 PM By: Worthy Keeler PA-C Entered By: Army Melia on 10/18/2018 15:18:05 Joshua Roth ([id number]) -------------------------------------------------------------------------------- Problem List Details Patient Name: Joshua Roth Date of Service: 10/18/2018 2:15 PM Medical Record Number: [id number] Patient Account Number: [id number] Date of Birth/Sex: 24-Jan-1941 (77 y.o. M) Treating RN: Army Melia Primary Care Provider: [name] Other Clinician: Referring Provider: [name] Treating Provider/Extender: Melburn Hake, HOYT Weeks in Treatment: 0 Active Problems ICD-10 Evaluated Encounter Code Description Active Date Today Diagnosis L84 Corns and callosities 10/18/2018 No Yes E11.622 Type 2 diabetes mellitus with other skin ulcer 10/18/2018 No Yes I48.20 Chronic atrial fibrillation, unspecified 10/18/2018 No Yes Z79.01 Long term (current) use of anticoagulants 10/18/2018 No Yes I25.10 Atherosclerotic heart disease of native coronary artery 10/18/2018 No Yes without angina pectoris I10 Essential (primary) hypertension 10/18/2018 No Yes Inactive Problems Resolved Problems Electronic Signature(s) Signed: 10/18/2018 3:13:55 PM By: Worthy Keeler PA-C Entered By: Worthy Keeler on 10/18/2018 15:13:54 Joshua Roth  ([id number]) -------------------------------------------------------------------------------- Progress Note Details Patient Name: Joshua Roth Date of Service: 10/18/2018 2:15 PM Medical Record Number: [id number] Patient Account Number: [id number] Date of Birth/Sex: 01-22-1941 (77 y.o. M) Treating RN: Army Melia Primary Care Provider: [name] Other Clinician: Referring Provider: [name] Treating Provider/Extender: Melburn Hake, HOYT Weeks in Treatment: 0 Subjective Chief Complaint Information obtained from Patient Toe callus/cellulitis History of Present Illness (HPI) 10/18/18 upon evaluation today patient presents for initial evaluation our clinic concerning issues he's been having with his right third toe. He tells me that he actually had an infection at the site he was seen by his family doctor in [place] prior to moving to [place]. Subsequently he tells me that his doctor actually placed him on an antibiotic for cellulitis he believes this may of been Keflex. With that being said he also  wanted to follow up and be seen here which is why the patient has made the appointment to evaluate here with [place] today. Subsequently this is a patient who does have diabetes. He also has chronic a trophy relation, his own long-term anticoagulant therapy due to this, has had a recent heart attack this seems to be doing well, and has hypertension. At this time the does not really appear to be open wound based on what I'm seeing at this point. He does have a bit of the callous at this site some of that pulled off just with gentle cleaning over the area by the nurse prior to becoming in. Subsequently when I came in they were still callous buildup around the tip of his toe during my evaluation. Patient History Information obtained from Patient. Allergies No Known Drug Allergies Family History No family history of Cancer, Diabetes, Heart Disease, Hereditary Spherocytosis,  Hypertension, Kidney Disease, Lung Disease, Seizures, Stroke, Thyroid Problems, Tuberculosis. Social History Never smoker, Marital Status - Married, Alcohol Use - Moderate, Drug Use - No History, Caffeine Use - Daily. Medical History Respiratory Patient has history of Asthma Denies history of Aspiration, Chronic Obstructive Pulmonary Disease (COPD), Pneumothorax, Sleep Apnea, Tuberculosis Cardiovascular Patient has history of Arrhythmia - a fib, Coronary Artery Disease, Hypertension Denies history of Angina, Congestive Heart Failure, Deep Vein Thrombosis, Hypotension, Myocardial Infarction, Peripheral Arterial Disease, Peripheral Venous Disease, Phlebitis, Vasculitis Gastrointestinal Denies history of Cirrhosis , Colitis, Crohn s, Hepatitis A, Hepatitis B, Hepatitis C Endocrine Patient has history of Type II Diabetes Denies history of Type I Diabetes Integumentary (Skin) Denies history of History of Burn, History of pressure wounds Neurologic TERESO, UNANGST ([id number]) Patient has history of Neuropathy Denies history of Dementia, Quadriplegia, Paraplegia, Seizure Disorder Patient is treated with Oral Agents. Medical And Surgical History Notes Gastrointestinal GERD Review of Systems (ROS) Constitutional Symptoms (General Health) Denies complaints or symptoms of Fatigue, Fever, Chills, Marked Weight Change. Eyes Denies complaints or symptoms of Dry Eyes, Vision Changes, Glasses / Contacts. Ear/Nose/Mouth/Throat Denies complaints or symptoms of Difficult clearing ears, Sinusitis. Hematologic/Lymphatic Denies complaints or symptoms of Bleeding / Clotting Disorders, Human Immunodeficiency Virus. Respiratory Denies complaints or symptoms of Chronic or frequent coughs, Shortness of Breath. Cardiovascular Denies complaints or symptoms of Chest pain, LE edema. Gastrointestinal Denies complaints or symptoms of Frequent diarrhea, Nausea, Vomiting. Endocrine Denies complaints or  symptoms of Hepatitis, Thyroid disease, Polydypsia (Excessive Thirst). Genitourinary Denies complaints or symptoms of Kidney failure/ Dialysis, Incontinence/dribbling. Immunological Denies complaints or symptoms of Hives, Itching. Integumentary (Skin) Denies complaints or symptoms of Wounds, Bleeding or bruising tendency, Breakdown, Swelling. Musculoskeletal Denies complaints or symptoms of Muscle Pain, Muscle Weakness. Neurologic Denies complaints or symptoms of Numbness/parasthesias, Focal/Weakness. Psychiatric Denies complaints or symptoms of Anxiety, Claustrophobia. Objective Constitutional patient is hypertensive.. pulse regular and within target range for patient.[name] respirations regular, non-labored and within target range for patient.[name] temperature within target range for patient.. Well-nourished and well-hydrated in no acute distress. Vitals Time Taken: 2:40 PM, Height: 73 in, Source: Measured, Weight: 205 lbs, Source: Measured, BMI: 27, Temperature: 98.8 F, Pulse: 86 bpm, Respiratory Rate: 16 breaths/min, Blood Pressure: 130/84 mmHg. Eyes conjunctiva clear no eyelid edema noted. pupils equal round and reactive to light and accommodation. Joshua Roth ([id number]) Ears, Nose, Mouth, and Throat no gross abnormality of ear auricles or external auditory canals. normal hearing noted during conversation. mucus membranes moist. Respiratory normal breathing without difficulty. clear to auscultation bilaterally. Cardiovascular regular rate and rhythm with normal S1, S2. 2+ dorsalis pedis/posterior  tibialis pulses. no clubbing, cyanosis, significant edema, Gastrointestinal (GI) soft, non-tender, non-distended, +BS. no ventral hernia noted. Musculoskeletal normal gait and posture. no significant deformity or arthritic changes, no loss or range of motion, no clubbing. Psychiatric this patient is able to make decisions and demonstrates good insight into disease process. Alert and  Oriented x 3. pleasant and cooperative. General Notes: Upon inspection today patient's wound actually appears to be completely healed. There was some callous noted at the site of this did require some pairing away which he tolerated with no pain and no bleeding. Overall I feel like he is doing quite well I see nothing that is gonna require ongoing wound care. Other Condition(s) Patient presents with Other Dermatologic Condition located on the Right Foot. General Notes: patient with a callus on his right 3rd toe Assessment Active Problems ICD-10 Corns and callosities Type 2 diabetes mellitus with other skin ulcer Chronic atrial fibrillation, unspecified Long term (current) use of anticoagulants Atherosclerotic heart disease of native coronary artery without angina pectoris Essential (primary) hypertension Procedures A Callus Pairing procedure was performed. by STONE III, HOYT E., PA-C. Post procedure Diagnosis Wound #: Same as Pre-Procedure STAFFORD, RIVIERA ([id number]) Plan [facility]: Discharge from [facility] - no treatment needed At this point I'm recommend that we discontinue any wound care services the really is not much that I can offer the patient at this point he seems to be doing quite well once we got the callous often that came off quite nicely. We will plan to see him back in the future as needed if he has any concerns or questions. Electronic Signature(s) Signed: 10/20/2018 3:12:54 AM By: Worthy Keeler PA-C Entered By: Worthy Keeler on 10/20/2018 02:56:01 Joshua Roth ([id number]) -------------------------------------------------------------------------------- ROS/PFSH Details Patient Name: Joshua Roth Date of Service: 10/18/2018 2:15 PM Medical Record Number: [id number] Patient Account Number: [id number] Date of Birth/Sex: [birth year] (77 y.o. M) Treating RN: Montey Hora Primary Care Provider: [name] Other  Clinician: Referring Provider: [name] Treating Provider/Extender: Melburn Hake, HOYT Weeks in Treatment: 0 Information Obtained From Patient Constitutional Symptoms (General Health) Complaints and Symptoms: Negative for: Fatigue; Fever; Chills; Marked Weight Change Eyes Complaints and Symptoms: Negative for: Dry Eyes; Vision Changes; Glasses / Contacts Ear/Nose/Mouth/Throat Complaints and Symptoms: Negative for: Difficult clearing ears; Sinusitis Hematologic/Lymphatic Complaints and Symptoms: Negative for: Bleeding / Clotting Disorders; Human Immunodeficiency Virus Respiratory Complaints and Symptoms: Negative for: Chronic or frequent coughs; Shortness of Breath Medical History: Positive for: Asthma Negative for: Aspiration; Chronic Obstructive Pulmonary Disease (COPD); Pneumothorax; Sleep Apnea; Tuberculosis Cardiovascular Complaints and Symptoms: Negative for: Chest pain; LE edema Medical History: Positive for: Arrhythmia - a fib; Coronary Artery Disease; Hypertension Negative for: Angina; Congestive Heart Failure; Deep Vein Thrombosis; Hypotension; Myocardial Infarction; Peripheral Arterial Disease; Peripheral Venous Disease; Phlebitis; Vasculitis Gastrointestinal Complaints and Symptoms: Negative for: Frequent diarrhea; Nausea; Vomiting Medical History: Negative for: Cirrhosis ; Colitis; Crohnos; Hepatitis A; Hepatitis B; Hepatitis C Past Medical History NotesKRUZE, ATCHLEY ([id number]) GERD Endocrine Complaints and Symptoms: Negative for: Hepatitis; Thyroid disease; Polydypsia (Excessive Thirst) Medical History: Positive for: Type II Diabetes Negative for: Type I Diabetes Time with diabetes: 25 years Treated with: Oral agents Genitourinary Complaints and Symptoms: Negative for: Kidney failure/ Dialysis; Incontinence/dribbling Immunological Complaints and Symptoms: Negative for: Hives; Itching Integumentary (Skin) Complaints and Symptoms: Negative  for: Wounds; Bleeding or bruising tendency; Breakdown; Swelling Medical History: Negative for: History of Burn; History of pressure wounds Musculoskeletal Complaints and Symptoms: Negative for: Muscle Pain; Muscle Weakness  Neurologic Complaints and Symptoms: Negative for: Numbness/parasthesias; Focal/Weakness Medical History: Positive for: Neuropathy Negative for: Dementia; Quadriplegia; Paraplegia; Seizure Disorder Psychiatric Complaints and Symptoms: Negative for: Anxiety; Claustrophobia Immunizations Pneumococcal Vaccine: Received Pneumococcal Vaccination: Yes Implantable Devices None Family and Social History CHEO, SELVEY ([id number]) Cancer: No; Diabetes: No; Heart Disease: No; Hereditary Spherocytosis: No; Hypertension: No; Kidney Disease: No; Lung Disease: No; Seizures: No; Stroke: No; Thyroid Problems: No; Tuberculosis: No; Never smoker; Marital Status - Married; Alcohol Use: Moderate; Drug Use: No History; Caffeine Use: Daily; Financial Concerns: No; Food, Clothing or Shelter Needs: No; Support System Lacking: No; Transportation Concerns: No Electronic Signature(s) Signed: 10/18/2018 4:45:21 PM By: Montey Hora Signed: 10/18/2018 10:42:11 PM By: Worthy Keeler PA-C Entered By: Montey Hora on 10/18/2018 15:06:57 Joshua Roth ([id number]) -------------------------------------------------------------------------------- SuperBill Details Patient Name: Joshua Roth Date of Service: 10/18/2018 Medical Record Number: [id number] Patient Account Number: [id number] Date of Birth/Sex: 06/21/1940 (77 y.o. M) Treating RN: Army Melia Primary Care Provider: [name] Other Clinician: Referring Provider: [name] Treating Provider/Extender: Melburn Hake, HOYT Weeks in Treatment: 0 Diagnosis Coding ICD-10 Codes Code Description L84 Corns and callosities E11.622 Type 2 diabetes mellitus with other skin ulcer I48.20 Chronic atrial fibrillation,  unspecified Z79.01 Long term (current) use of anticoagulants I25.10 Atherosclerotic heart disease of native coronary artery without angina pectoris I10 Essential (primary) hypertension Facility Procedures CPT4 Code: [id number] Description: [place] VISIT-LEV 3 EST PT Modifier: Quantity: 1 CPT4 Code: [id number] Description: 56389 - PARE BENIGN LES; SGL ICD-10 Diagnosis Description L84 Corns and callosities Modifier: Quantity: 1 Physician Procedures CPT4 Code: [id number] Description: WC PHYS LEVEL 3 o NEW PT ICD-10 Diagnosis Description L84 Corns and callosities E11.622 Type 2 diabetes mellitus with other skin ulcer I48.20 Chronic atrial fibrillation, unspecified Z79.01 Long term (current) use of anticoagulants Modifier: 25 Quantity: 1 CPT4 Code: [id number] Description: 62035 - WC PHYS PARE BENIGN LES; SGL ICD-10 Diagnosis Description L84 Corns and callosities Modifier: Quantity: 1 Electronic Signature(s) Signed: 10/18/2018 10:42:11 PM By: Worthy Keeler PA-C Entered By: Worthy Keeler on 10/18/2018 22:41:48

## 2018-10-22 ENCOUNTER — Ambulatory Visit: Payer: Self-pay | Admitting: *Deleted

## 2018-10-22 DIAGNOSIS — L97509 Non-pressure chronic ulcer of other part of unspecified foot with unspecified severity: Secondary | ICD-10-CM

## 2018-10-22 DIAGNOSIS — I2510 Atherosclerotic heart disease of native coronary artery without angina pectoris: Secondary | ICD-10-CM

## 2018-10-22 DIAGNOSIS — E11621 Type 2 diabetes mellitus with foot ulcer: Secondary | ICD-10-CM

## 2018-10-22 NOTE — Chronic Care Management (AMB) (Signed)
Chronic Care Management   Initial Visit Note  10/22/2018 Name: Joshua Roth MRN: [id number] DOB: [birth year]  Referred by: [name], NP Reason for referral : Chronic Care Management (DM, New to the area)   Joshua Roth is a 78 y.o. year old male who is a primary care patient of [name], NP. The CCM team was consulted for assistance with chronic disease management and care coordination needs.   Review of patient status, including review of consultants reports, relevant laboratory and other test results, and collaboration with appropriate care team members and the patient's provider was performed as part of comprehensive patient evaluation and provision of chronic care management services.    SDOH (Social Determinants of Health) screening performed today. See Care Plan Entry related to challenges with: Financial Strain  Stress(patient reports several high cost medications)   Initial outreach to patient. He reports he has not taken his xerelto for several days related to moving, unpacking and not having time to go get it. Encouraged patient this was a very important medication for him not to miss related to hx of afib and high risk for stroke. Patient verbalized understanding and planned to pick up medication today.   Medications: Outpatient Encounter Medications as of 10/22/2018  Medication Sig   albuterol (VENTOLIN HFA) 108 (90 Base) MCG/ACT inhaler Inhale 2 puffs into the lungs every 6 (six) hours as needed for wheezing or shortness of breath.   aspirin EC 81 MG tablet Take 81 mg by mouth daily.   atorvastatin (LIPITOR) 80 MG tablet Take 1 tablet (80 mg total) by mouth daily.   diclofenac sodium (VOLTAREN) 1 % GEL Apply 2 g topically as needed.   ferrous sulfate 325 (65 FE) MG EC tablet Take 325 mg by mouth daily.   Fluticasone-Salmeterol (WIXELA INHUB) 500-50 MCG/DOSE AEPB Inhale 1 puff into the lungs 2 (two) times daily.   furosemide (LASIX) 40 MG  tablet Take 1 tablet (40 mg total) by mouth daily.   glipiZIDE (GLUCOTROL) 5 MG tablet Take 1 tablet (5 mg total) by mouth 2 (two) times daily before a meal.   glucose blood (ONETOUCH VERIO) test strip Use as instructed   Iron-Vitamins (GERITOL PO) Take by mouth.   Lancets (ONETOUCH DELICA PLUS [id number]) MISC 1 Device by Does not apply route 2 (two) times a day.   montelukast (SINGULAIR) 10 MG tablet Take 1 tablet (10 mg total) by mouth at bedtime.   nitroGLYCERIN (NITROSTAT) 0.4 MG SL tablet Place 0.4 mg under the tongue every 5 (five) minutes as needed for chest pain.   pantoprazole (PROTONIX) 40 MG tablet Take 1 tablet (40 mg total) by mouth 2 (two) times daily.   potassium chloride SA (K-DUR) 20 MEQ tablet Take 2 tablets (40 mEq total) by mouth daily.   predniSONE (DELTASONE) 10 MG tablet Take 1 tablet (10 mg total) by mouth daily with breakfast.   rivaroxaban (XARELTO) 20 MG TABS tablet Take 20 mg by mouth daily with supper.   No facility-administered encounter medications on file as of 10/22/2018.      Objective:   Goals Addressed            This Visit's Progress    I just moved here trying to get settled (pt-stated)       Current Barriers:   Knowledge Deficits related to resources for medical team in the area  Nurse Case Manager Clinical Goal(s):   Over the next 120  days, patient will work with [facility]  to address needs related to new providers in the area related to patient just moved to [facility] from [place] area.   Interventions:   Provided education to patient re: the need to establish with a cardiologist in the area related to chronic heart conditions  Reviewed medications with patient and discussed patient is not taking xerelto at this time related to unapcking and has not picked up medication from new pharmacy.  Discussed plans with patient for ongoing care management follow up and provided patient with direct contact information for care management  team  Advised patient, providing education and rationale, to check cbg Qday and record, calling PCP for findings outside established parameters.    Pharmacy referral for Medication management and potential medication cost concerns- Patient states he has a couple of high cost medications.   Patient stated he has not unpacked his glucometer, admits to not checking sugar for several days.   Patient Self Care Activities:  Patient new to the area and needs to get established with new specialist providers.    Performs ADL's independently  Performs IADL's independently  Initial goal documentation         [name] was given information about Chronic Care Management services today including:  1. CCM service includes personalized support from designated clinical staff supervised by his physician, including individualized plan of care and coordination with other care providers 2. 24/7 contact phone numbers for assistance for urgent and routine care needs. 3. Service will only be billed when office clinical staff spend 20 minutes or more in a month to coordinate care. 4. Only one practitioner may furnish and bill the service in a calendar month. 5. The patient may stop CCM services at any time (effective at the end of the month) by phone call to the office staff. 6. The patient will be responsible for cost sharing (co-pay) of up to 20% of the service fee (after annual deductible is met).  Patient agreed to services and verbal consent obtained.   Plan:   The care management team will reach out to the patient again over the next 30 days.  The patient has been provided with contact information for the care management team and has been advised to call with any health related questions or concerns.    Merlene Morse Jakyra Kenealy RN, BSN Nurse Case Pharmacist, [facility]  [phone]) Business Mobile

## 2018-10-22 NOTE — Patient Instructions (Signed)
Thank you allowing the Chronic Care Management Team to be a part of your care! It was a pleasure speaking with you today!   CCM (Chronic Care Management) Team   Zondra Lawlor RN, BSN Nurse Care Coordinator  [phone]  [name] PharmD  Clinical Pharmacist  [phone]  [name] LCSW Clinical Social Worker [phone]  Goals Addressed            This Visit's Progress   . I just moved here trying to get settled (pt-stated)       Current Barriers:  [name] Knowledge Deficits related to resources for medical team in the area  Nurse Case Manager Clinical Goal(s):  [name] Over the next 120  days, patient will work with [facility]  to address needs related to new providers in the area related to patient just moved to [facility] from [place] area.   Interventions:  . Provided education to patient re: the need to establish with a cardiologist in the area related to chronic heart conditions . Reviewed medications with patient and discussed patient is not taking xerelto at this time related to unapcking and has not picked up medication from new pharmacy. . Discussed plans with patient for ongoing care management follow up and provided patient with direct contact information for care management team . Advised patient, providing education and rationale, to check cbg Qday and record, calling PCP for findings outside established parameters.   . Pharmacy referral for Medication management and potential medication cost concerns- Patient states he has a couple of high cost medications.  . Patient stated he has not unpacked his glucometer, admits to not checking sugar for several days.   Patient Self Care Activities: . Patient new to the area and needs to get established with new specialist providers.   . Performs ADL's independently . Performs IADL's independently  Initial goal documentation        The patient verbalized understanding of instructions provided today and declined a  print copy of patient instruction materials.   The patient has been provided with contact information for the care management team and has been advised to call with any health related questions or concerns.

## 2018-10-25 ENCOUNTER — Ambulatory Visit: Payer: Self-pay | Admitting: Pharmacist

## 2018-10-25 DIAGNOSIS — E11621 Type 2 diabetes mellitus with foot ulcer: Secondary | ICD-10-CM

## 2018-10-25 NOTE — Chronic Care Management (AMB) (Signed)
  Chronic Care Management   Follow Up Note   10/25/2018 Name: Joshua Roth MRN: [id number] DOB: 22-Dec-1940  Referred by: [name], NP Reason for referral : Chronic Care Management (Initial Patient Outreach Call)   [name] is a 78 y.o. year old male who is a primary care patient of [name], NP. The CCM team was consulted for assistance with chronic disease management and care coordination needs.    Receive referral from [place] for medication review and to confirm that he has a working glucometer  Was unable to reach patient via telephone today and unable to leave a message as no voicemail picks up.  Plan  The care management team will reach out to the patient again over the next 14 days.   [name], PharmD, [place] [employer] [phone]

## 2018-10-26 ENCOUNTER — Ambulatory Visit: Payer: Medicare Other

## 2018-10-26 ENCOUNTER — Encounter: Payer: Self-pay | Admitting: Podiatry

## 2018-10-26 DIAGNOSIS — E11621 Type 2 diabetes mellitus with foot ulcer: Secondary | ICD-10-CM

## 2018-10-26 DIAGNOSIS — L97519 Non-pressure chronic ulcer of other part of right foot with unspecified severity: Secondary | ICD-10-CM

## 2018-10-30 ENCOUNTER — Ambulatory Visit: Payer: Self-pay | Admitting: Licensed Clinical Social Worker

## 2018-10-30 ENCOUNTER — Encounter: Payer: Self-pay | Admitting: Nurse Practitioner

## 2018-10-30 ENCOUNTER — Other Ambulatory Visit: Payer: Self-pay | Admitting: Nurse Practitioner

## 2018-10-30 ENCOUNTER — Ambulatory Visit (INDEPENDENT_AMBULATORY_CARE_PROVIDER_SITE_OTHER): Payer: Self-pay | Admitting: Pharmacist

## 2018-10-30 DIAGNOSIS — E11621 Type 2 diabetes mellitus with foot ulcer: Secondary | ICD-10-CM

## 2018-10-30 DIAGNOSIS — I4891 Unspecified atrial fibrillation: Secondary | ICD-10-CM

## 2018-10-30 DIAGNOSIS — I2510 Atherosclerotic heart disease of native coronary artery without angina pectoris: Secondary | ICD-10-CM

## 2018-10-30 DIAGNOSIS — L97509 Non-pressure chronic ulcer of other part of unspecified foot with unspecified severity: Secondary | ICD-10-CM

## 2018-10-30 NOTE — Patient Instructions (Signed)
Thank you allowing the Chronic Care Management Team to be a part of your care! It was a pleasure speaking with you today!     CCM (Chronic Care Management) Team    [name] RN, BSN Nurse Care Coordinator  [phone]   [name] PharmD  Clinical Pharmacist  [phone]   [name] LCSW Clinical Social Worker [phone]  Visit Information  Goals Addressed            This Visit's Progress   . Medication Management       Current Barriers:  . Financial Barriers - reports currently has Medicare Part A and B; no current Medicare Part D coverage . Lack of blood sugar results for clinical team . Lack of available lab results as patient is new to the office/area  Pharmacist Clinical Goal(s):  [name] Over the next 30 days, patient will work with CM Pharmacist and PCP to address needs related to medication regimen optimization and coordination of care  Interventions: . Comprehensive medication review performed; medication list updated in electronic medical record o Reports currently taking Eliquis as prescribed by previous Cardiologist. Reports had recently been on Xarelto for a couple of weeks as he had samples remaining, but then had prescription refilled and resumed Eliquis o Identify patient's current supply and prescription of Nitrostat expired o Identify patient in need of refill of potassium chloride . Counsel on importance of blood sugar control and monitoring o Reports that he has recently moved and has not yet unpacked his One Touch Verio meter, but has located the test strips o Reports not having checked blood sugar in the past 5-6 weeks o Reports previously morning readings typically ran: ~85-105 mg/dL o Denies any recent signs of low blood sugar o Encourage patient to locate meter and, if unable to locate, to call PCP to request new prescription for meter . Provide patient with contact information for Seniors? Health Insurance Information Program  [facility]) Counselor for assistance with signing up for Medicare Part D coverage . Will follow up with PCP to request referral to Cardiologist for patient.  o Mr. Hatchel requests referral for Cardiologist as he is new to the area.  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 Counsel on medication assistance options, including patient assistance programs o Mr. Dorko to review finances and follow up with CM Pharmacist if he meets criteria for programs and is interested in applying . Will mail patient blood pressure and blood sugar logs as directed  Patient Self Care Activities:  . Self administers medications as prescribed . Attends all scheduled provider appointments o Next PCP appointment scheduled for 10/29 . Calls pharmacy for medication refills . Calls provider office for new concerns or questions  Initial goal documentation        The patient verbalized understanding of instructions provided today and declined a print copy of patient instruction materials.   The care management team will reach out to the patient again over the next 14 days.   [name], PharmD, [place] [employer] [phone]

## 2018-10-30 NOTE — Chronic Care Management (AMB) (Signed)
Chronic Care Management   Note  10/30/2018 Name: Joshua Roth MRN: [id number] DOB: 1940-05-11   Subjective:   Joshua Roth is a 78 y.o. year old male who is a primary care patient of Joshua College, NP. The CM team was consulted for assistance with chronic disease management and care coordination. 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  Receive referral from [place] for medication review and to confirm that he has a working glucometer  I reached out to [employer] by phone today.   Review of patient status, including review of consultants reports, laboratory and other test data, was performed as part of comprehensive evaluation and provision of chronic care management services.   Objective:  BP Readings from Last 3 Encounters:  10/02/18 (!) 111/46    No Known Allergies  Medications Reviewed Today    Reviewed by Vella Raring, Section (Pharmacist) on 10/30/18 at 1502  Med List Status: <None>  Medication Order Taking? Sig Documenting Provider Last Dose Status Informant  albuterol (VENTOLIN HFA) 108 (90 Base) MCG/ACT inhaler [id number] Yes Inhale 2 puffs into the lungs every 6 (six) hours as needed for wheezing or shortness of breath. Joshua College, NP Taking Active   apixaban (ELIQUIS) 5 MG TABS tablet [id number] Yes Take 5 mg by mouth 2 (two) times daily. [provider] Taking Active   atorvastatin (LIPITOR) 80 MG tablet [id number] Yes Take 1 tablet (80 mg total) by mouth daily. Joshua College, NP Taking Active   diclofenac sodium (VOLTAREN) 1 % GEL [id number] Yes Apply 2 g topically as needed. [provider] Taking Active   ferrous sulfate 325 (65 FE) MG EC tablet [id number] Yes Take 325 mg by mouth daily. [provider] Taking Active            Med Note Winfield Cunas, [facility] A   Tue Oct 30, 2018  2:46 PM) Taking in  morning prior to breakfast  Fluticasone-Salmeterol [facility] INHUB) 500-50 MCG/DOSE AEPB [id number] Yes Inhale 1 puff into the lungs 2 (two) times daily. [provider] Taking Active   furosemide (LASIX) 40 MG tablet [id number] Yes Take 1 tablet (40 mg total) by mouth daily. Joshua College, NP Taking Active   glipiZIDE (GLUCOTROL) 5 MG tablet [id number] Yes Take 1 tablet (5 mg total) by mouth 2 (two) times daily before a meal. Joshua College, NP Taking Active   glucose blood [facility] VERIO) test strip [id number]  Use as instructed Joshua College, NP  Active   Iron-Vitamins (GERITOL PO) [id number] Yes Take by mouth. [provider] Taking Active   Lancets (ONETOUCH DELICA PLUS Q000111Q) [place] [id number]  1 Device by Does not apply route 2 (two) times a day. Joshua College, NP  Active   montelukast (SINGULAIR) 10 MG tablet [id number] Yes Take 1 tablet (10 mg total) by mouth at bedtime. Joshua College, NP Taking Active   nitroGLYCERIN (NITROSTAT) 0.4 MG SL tablet [id number] Yes Place 0.4 mg under the tongue every 5 (five) minutes as needed for chest pain. [provider] Taking Active   pantoprazole (PROTONIX) 40 MG tablet [id number] Yes Take 1 tablet (40 mg total) by mouth 2 (two) times daily. Joshua College, NP Taking Active   potassium chloride SA (K-DUR) 20 MEQ tablet [id number] Yes Take 2 tablets (40 mEq total) by mouth daily. Joshua College, NP Taking Active  predniSONE (DELTASONE) 10 MG tablet [id number] Yes Take 1 tablet (10 mg total) by mouth daily with breakfast. Joshua College, NP Taking Active            Assessment:   Goals Addressed            This Visit's Progress   . Medication Management       Current Barriers:  . Financial Barriers - reports currently has Medicare Part A and B; no current Medicare Part D coverage . Lack of blood sugar results for clinical team . Lack of available lab results as  patient is new to the office/area  Pharmacist Clinical Goal(s):  [name] Over the next 30 days, patient will work with CM Pharmacist and PCP to address needs related to medication regimen optimization and coordination of care  Interventions: . Comprehensive medication review performed; medication list updated in electronic medical record o Reports currently taking Eliquis as prescribed by previous Cardiologist. Reports had recently been on Xarelto for a couple of weeks as he had samples remaining, but then had prescription refilled and resumed Eliquis o Identify patient's current supply and prescription of Nitrostat expired o Identify patient in need of refill of potassium chloride . Counsel on importance of blood sugar control and monitoring o Reports that he has recently moved and has not yet unpacked his One Touch Verio meter, but has located the test strips o Reports not having checked blood sugar in the past 5-6 weeks o Reports previously morning readings typically ran: ~85-105 mg/dL o Denies any recent signs of low blood sugar o Encourage patient to locate meter and, if unable to locate, to call PCP to request new prescription for meter . Provide patient with contact information for Seniors? Health Insurance Information Program [facility]) Counselor for assistance with signing up for Medicare Part D coverage . Will follow up with PCP to request referral to Cardiologist for patient.  o Joshua Roth requests referral for Cardiologist as he is new to the area.  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 Counsel on medication assistance options, including patient assistance programs o Joshua Roth to review finances and follow up with CM Pharmacist if he meets criteria for programs and is interested in applying . Will mail patient blood pressure and blood sugar logs as directed  Patient Self Care Activities:  .  Self administers medications as prescribed . Attends all scheduled provider appointments o Next PCP appointment scheduled for 10/29 . Calls pharmacy for medication refills . Calls provider office for new concerns or questions  Initial goal documentation        Plan:  The care management team will reach out to the patient again over the next 14 days.   [name], PharmD, [place] [employer] [phone]

## 2018-10-30 NOTE — Chronic Care Management (AMB) (Signed)
  Chronic Care Management    Clinical Social Work Follow Up Note  10/30/2018 Name: Joshua Roth MRN: [id number] DOB: 1940/05/05  Joshua Roth is a 78 y.o. year old male who is a primary care patient of [name], NP. The CCM team was consulted for assistance with Financial Difficulties related to insurance coverage.   Review of patient status, including review of consultants reports, other relevant assessments, and collaboration with appropriate care team members and the patient's provider was performed as part of comprehensive patient evaluation and provision of chronic care management services.    Outpatient Encounter Medications as of 10/30/2018  Medication Sig  . albuterol (VENTOLIN HFA) 108 (90 Base) MCG/ACT inhaler Inhale 2 puffs into the lungs every 6 (six) hours as needed for wheezing or shortness of breath.  [name] aspirin EC 81 MG tablet Take 81 mg by mouth daily.  [name] atorvastatin (LIPITOR) 80 MG tablet Take 1 tablet (80 mg total) by mouth daily.  . diclofenac sodium (VOLTAREN) 1 % GEL Apply 2 g topically as needed.  . ferrous sulfate 325 (65 FE) MG EC tablet Take 325 mg by mouth daily.  . Fluticasone-Salmeterol (WIXELA INHUB) 500-50 MCG/DOSE AEPB Inhale 1 puff into the lungs 2 (two) times daily.  . furosemide (LASIX) 40 MG tablet Take 1 tablet (40 mg total) by mouth daily.  [name] glipiZIDE (GLUCOTROL) 5 MG tablet Take 1 tablet (5 mg total) by mouth 2 (two) times daily before a meal.  . glucose blood (ONETOUCH VERIO) test strip Use as instructed  . Iron-Vitamins (GERITOL PO) Take by mouth.  . Lancets (ONETOUCH DELICA PLUS Q000111Q) MISC 1 Device by Does not apply route 2 (two) times a day.  . montelukast (SINGULAIR) 10 MG tablet Take 1 tablet (10 mg total) by mouth at bedtime.  . nitroGLYCERIN (NITROSTAT) 0.4 MG SL tablet Place 0.4 mg under the tongue every 5 (five) minutes as needed for chest pain.  . pantoprazole (PROTONIX) 40 MG tablet Take 1 tablet (40 mg total) by mouth  2 (two) times daily.  . potassium chloride SA (K-DUR) 20 MEQ tablet Take 2 tablets (40 mEq total) by mouth daily.  . predniSONE (DELTASONE) 10 MG tablet Take 1 tablet (10 mg total) by mouth daily with breakfast.  . rivaroxaban (XARELTO) 20 MG TABS tablet Take 20 mg by mouth daily with supper.   No facility-administered encounter medications on file as of 10/30/2018.    LCSW completed CCM outreach attempt today but was unable to reach patient successfully. A HIPPA compliant voice message was unable to be left as mailbox had not been set up yet. LCSW rescheduled CCM SW appointment as well.  Follow Up Plan: SW will reach out to client by phone to follow up Rew patient decision about enrollment in CCM services on: 81 days  [name], [employer], MSW, [email or web address] Phone: [phone]

## 2018-10-30 NOTE — Progress Notes (Signed)
Establish care for ongoing management of afib, CAD.  Previously followed by Cardiology and moved to the area.

## 2018-10-31 ENCOUNTER — Other Ambulatory Visit: Payer: Self-pay | Admitting: Nurse Practitioner

## 2018-10-31 DIAGNOSIS — I2510 Atherosclerotic heart disease of native coronary artery without angina pectoris: Secondary | ICD-10-CM

## 2018-10-31 MED ORDER — NITROGLYCERIN 0.4 MG SL SUBL: 0.4000 mg | SUBLINGUAL_TABLET | SUBLINGUAL | 1 refills | Status: AC | PRN

## 2018-11-01 ENCOUNTER — Telehealth: Payer: Self-pay

## 2018-11-02 NOTE — Progress Notes (Signed)
This encounter was created in error - please disregard.

## 2018-11-13 ENCOUNTER — Ambulatory Visit: Payer: Self-pay | Admitting: Pharmacist

## 2018-11-13 ENCOUNTER — Telehealth: Payer: Self-pay

## 2018-11-13 NOTE — Chronic Care Management (AMB) (Signed)
  Chronic Care Management   Follow Up Note   11/13/2018 Name: Joshua Roth MRN: [id number] DOB: 1941/03/02  Referred by: [name], NP Reason for referral : Chronic Care Management (Patient Phone Call)   Joshua Roth is a 78 y.o. year old male who is a primary care patient of [name], NP. The CCM team was consulted for assistance with chronic disease management and care coordination needs.    Was unable to reach patient via telephone today and have left HIPAA compliant voicemail asking patient to return my call.   Plan  The care management team will reach out to the patient again over the next 14 days.   [name], PharmD, [place] [employer] [phone]

## 2018-11-27 ENCOUNTER — Ambulatory Visit (INDEPENDENT_AMBULATORY_CARE_PROVIDER_SITE_OTHER): Payer: Medicare Other | Admitting: Pharmacist

## 2018-11-27 DIAGNOSIS — I4891 Unspecified atrial fibrillation: Secondary | ICD-10-CM | POA: Diagnosis not present

## 2018-11-27 DIAGNOSIS — E11621 Type 2 diabetes mellitus with foot ulcer: Secondary | ICD-10-CM

## 2018-11-27 DIAGNOSIS — L97509 Non-pressure chronic ulcer of other part of unspecified foot with unspecified severity: Secondary | ICD-10-CM

## 2018-11-27 NOTE — Patient Instructions (Signed)
Thank you allowing the Chronic Care Management Team to be a part of your care! It was a pleasure speaking with you today!     CCM (Chronic Care Management) Team    [name] RN, BSN Nurse Care Coordinator  [phone]   [name] PharmD  Clinical Pharmacist  [phone]   [name] LCSW Clinical Social Worker [phone]  Visit Information  Goals Addressed            This Visit's Progress   . PharmD - Medication Management       Current Barriers:  . Financial Barriers - reports currently has Medicare Part A and B; no current Medicare Part D coverage . Lack of blood sugar and blood pressure results for clinical team . Lack of available lab results as patient is new to the office/area  Pharmacist Clinical Goal(s):  [name] Over the next 30 days, patient will work with CM Pharmacist and PCP to address needs related to medication regimen optimization and coordination of care  Interventions: . Counsel on importance of medication adherence o Mr. Neenan confirms that he picked up new Nitrostat prescription, as called in by his PCP o Identify patient in need of refill of Eliquis and Wixela inhaler. Reports having refills. States that he will call into pharmacy and pick up. [name] on importance of blood sugar control and monitoring o Reports that he has now unpacked his One Touch Verio meter and supplies. Started testing blood sugar today - Reports morning fasting CBG of 95 mg/dL o Denies any recent signs of low blood sugar o Confirms receiving blood sugar log in mail o Encourage patient to start checking blood sugar regularly and keeping log . Provide patient with contact information for Seniors? Health Insurance Information Program [facility]) Counselor again for assistance with signing up for Medicare Part D coverage . Confirms having scheduled follow up appointment with Cardiologist, as referred by PCP  o Confirms having received blood pressure log in mail and  locating blood pressure monitor, but denies monitoring o Encourage patient to monitor, keep log and bring with him to his medical appointments . Follow up with patient regarding medication assistance - Mr. Simmering denies need for assistance at this time.  Patient Self Care Activities:  . Self administers medications as prescribed . Attends all scheduled provider appointments o Cardiologist appointment scheduled for 9/29 o Next PCP appointment scheduled for 10/29 . Calls pharmacy for medication refills . Calls provider office for new concerns or questions  Please see past updates related to this goal by clicking on the "Past Updates" button in the selected goal         The patient verbalized understanding of instructions provided today and declined a print copy of patient instruction materials.   Telephone follow up appointment with care management team member scheduled for: 10/6 at 2 pm  [name], PharmD, [place] [phone]

## 2018-11-27 NOTE — Chronic Care Management (AMB) (Signed)
Chronic Care Management   Follow Up Note   11/27/2018 Name: Yandel Valls MRN: [id number] DOB: 10/24/1940  Referred by: [name], NP Reason for referral : Chronic Care Management (Patient Phone Call)   Harrington Tatom is a 78 y.o. year old male who is a primary care patient of [name], NP. The CCM team was consulted for assistance with chronic disease management and care coordination needs.  Mr. 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  I reached out to [employer] by phone today.   Review of patient status, including review of consultants reports, relevant laboratory and other test results, and collaboration with appropriate care team members and the patient's provider was performed as part of comprehensive patient evaluation and provision of chronic care management services.    Outpatient Encounter Medications as of 11/27/2018  Medication Sig Note  . glipiZIDE (GLUCOTROL) 5 MG tablet Take 1 tablet (5 mg total) by mouth 2 (two) times daily before a meal.   . albuterol (VENTOLIN HFA) 108 (90 Base) MCG/ACT inhaler Inhale 2 puffs into the lungs every 6 (six) hours as needed for wheezing or shortness of breath.   [name] apixaban (ELIQUIS) 5 MG TABS tablet Take 5 mg by mouth 2 (two) times daily.   [name] atorvastatin (LIPITOR) 80 MG tablet Take 1 tablet (80 mg total) by mouth daily.   . diclofenac sodium (VOLTAREN) 1 % GEL Apply 2 g topically as needed.   . ferrous sulfate 325 (65 FE) MG EC tablet Take 325 mg by mouth daily. 10/30/2018: Taking in morning prior to breakfast  . fluorouracil (EFUDEX) 5 % cream Apply topically 2 (two) times daily.   . Fluticasone-Salmeterol (WIXELA INHUB) 500-50 MCG/DOSE AEPB Inhale 1 puff into the lungs 2 (two) times daily.   . furosemide (LASIX) 40 MG tablet Take 1 tablet (40 mg total) by mouth daily.   [name] glucose blood (ONETOUCH VERIO) test strip Use as  instructed   . Iron-Vitamins (GERITOL PO) Take by mouth.   . Lancets (ONETOUCH DELICA PLUS Q000111Q) MISC 1 Device by Does not apply route 2 (two) times a day.   . montelukast (SINGULAIR) 10 MG tablet Take 1 tablet (10 mg total) by mouth at bedtime.   . nitroGLYCERIN (NITROSTAT) 0.4 MG SL tablet Place 1 tablet (0.4 mg total) under the tongue every 5 (five) minutes as needed for chest pain (up to 3 doses).   . pantoprazole (PROTONIX) 40 MG tablet Take 1 tablet (40 mg total) by mouth 2 (two) times daily.   . potassium chloride SA (K-DUR) 20 MEQ tablet Take 2 tablets (40 mEq total) by mouth daily.   . predniSONE (DELTASONE) 10 MG tablet Take 1 tablet (10 mg total) by mouth daily with breakfast.    No facility-administered encounter medications on file as of 11/27/2018.     Goals Addressed            This Visit's Progress   . PharmD - Medication Management       Current Barriers:  . Financial Barriers - reports currently has Medicare Part A and B; no current Medicare Part D coverage . Lack of blood sugar and blood pressure results for clinical team . Lack of available lab results as patient is new to the office/area  Pharmacist Clinical Goal(s):  [name] Over the next 30 days, patient will work with [employer] Pharmacist and PCP to address needs related  to medication regimen optimization and coordination of care  Interventions: . Counsel on importance of medication adherence o Mr. Godina confirms that he picked up new Nitrostat prescription, as called in by his PCP o Identify patient in need of refill of Eliquis and Wixela inhaler. Reports having refills. States that he will call into pharmacy and pick up. [name] on importance of blood sugar control and monitoring o Reports that he has now unpacked his One Touch Verio meter and supplies. Started testing blood sugar today - Reports morning fasting CBG of 95 mg/dL o Denies any recent signs of low blood sugar o Confirms receiving blood sugar log in  mail o Encourage patient to start checking blood sugar regularly and keeping log . Provide patient with contact information for Seniors? Health Insurance Information Program [facility]) Counselor again for assistance with signing up for Medicare Part D coverage . Confirms having scheduled follow up appointment with Cardiologist, as referred by PCP  o Confirms having received blood pressure log in mail and locating blood pressure monitor, but denies monitoring o Encourage patient to monitor, keep log and bring with him to his medical appointments . Follow up with patient regarding medication assistance - Mr. Mcfadyen denies need for assistance at this time.  Patient Self Care Activities:  . Self administers medications as prescribed . Attends all scheduled provider appointments o Cardiologist appointment scheduled for 9/29 o Next PCP appointment scheduled for 10/29 . Calls pharmacy for medication refills . Calls provider office for new concerns or questions  Please see past updates related to this goal by clicking on the "Past Updates" button in the selected goal         Plan  Telephone follow up appointment with care management team member scheduled for: 10/6 at 2 pm  [name], PharmD, [place] [phone]

## 2018-12-03 ENCOUNTER — Ambulatory Visit: Payer: Medicare Other | Admitting: *Deleted

## 2018-12-03 DIAGNOSIS — L97509 Non-pressure chronic ulcer of other part of unspecified foot with unspecified severity: Secondary | ICD-10-CM

## 2018-12-03 DIAGNOSIS — E11621 Type 2 diabetes mellitus with foot ulcer: Secondary | ICD-10-CM

## 2018-12-03 DIAGNOSIS — L03031 Cellulitis of right toe: Secondary | ICD-10-CM

## 2018-12-03 DIAGNOSIS — I4891 Unspecified atrial fibrillation: Secondary | ICD-10-CM

## 2018-12-03 NOTE — Chronic Care Management (AMB) (Signed)
Chronic Care Management   Follow Up Note   12/03/2018 Name: Joshua Roth MRN: [id number] DOB: 12-11-1940  Referred by: [name], NP Reason for referral : Chronic Care Management (DM, COPD, Afib)   Joshua Roth is a 78 y.o. year old male who is a primary care patient of [name], NP. The CCM team was consulted for assistance with chronic disease management and care coordination needs.    Review of patient status, including review of consultants reports, relevant laboratory and other test results, and collaboration with appropriate care team members and the patient's provider was performed as part of comprehensive patient evaluation and provision of chronic care management services.    SDOH (Social Determinants of Health) screening performed today: None. See Care Plan for related entries.   Advanced Directives Status: N See Care Plan and [employer]yn[employer] application for related entries.  Outpatient Encounter Medications as of 12/03/2018  Medication Sig Note  . albuterol (VENTOLIN HFA) 108 (90 Base) MCG/ACT inhaler Inhale 2 puffs into the lungs every 6 (six) hours as needed for wheezing or shortness of breath.   [name] apixaban (ELIQUIS) 5 MG TABS tablet Take 5 mg by mouth 2 (two) times daily.   [name] atorvastatin (LIPITOR) 80 MG tablet Take 1 tablet (80 mg total) by mouth daily.   . diclofenac sodium (VOLTAREN) 1 % GEL Apply 2 g topically as needed.   . ferrous sulfate 325 (65 FE) MG EC tablet Take 325 mg by mouth daily. 10/30/2018: Taking in morning prior to breakfast  . fluorouracil (EFUDEX) 5 % cream Apply topically 2 (two) times daily.   . Fluticasone-Salmeterol (WIXELA INHUB) 500-50 MCG/DOSE AEPB Inhale 1 puff into the lungs 2 (two) times daily.   . furosemide (LASIX) 40 MG tablet Take 1 tablet (40 mg total) by mouth daily.   [name] glipiZIDE (GLUCOTROL) 5 MG tablet Take 1 tablet (5 mg total) by mouth 2 (two) times daily before a meal.   . glucose blood (ONETOUCH VERIO) test  strip Use as instructed   . Iron-Vitamins (GERITOL PO) Take by mouth.   . Lancets (ONETOUCH DELICA PLUS Q000111Q) MISC 1 Device by Does not apply route 2 (two) times a day.   . montelukast (SINGULAIR) 10 MG tablet Take 1 tablet (10 mg total) by mouth at bedtime.   . nitroGLYCERIN (NITROSTAT) 0.4 MG SL tablet Place 1 tablet (0.4 mg total) under the tongue every 5 (five) minutes as needed for chest pain (up to 3 doses).   . pantoprazole (PROTONIX) 40 MG tablet Take 1 tablet (40 mg total) by mouth 2 (two) times daily.   . potassium chloride SA (K-DUR) 20 MEQ tablet Take 2 tablets (40 mEq total) by mouth daily.   . predniSONE (DELTASONE) 10 MG tablet Take 1 tablet (10 mg total) by mouth daily with breakfast.    No facility-administered encounter medications on file as of 12/03/2018.      Goals Addressed            This Visit's Progress   . RN-I just moved here trying to get settled (pt-stated)       Current Barriers:  [name] Knowledge Deficits related to resources for medical team in the area  Nurse Case Manager Clinical Goal(s):  [name] Over the next 120  days, patient will work with [facility]  to address needs related to new providers in the area related to patient just moved to [facility] from [place] area.   Interventions:  . Reviewed medications with patient  and discussed Patient states he is back on his Eloquis for stroke prevention related to Afib. [name] Discussed plans with patient for ongoing care management follow up and provided patient with direct contact information for care management team . Reviewed scheduled/upcoming provider appointments including: Cardiology appt tomorrow 9/29 @ 11:20 . Patient stated he has  been checking sugar last few fasting sugars are 89, 90, 91 . Patient states post prandial sugars run around 169 . 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 . Patient stating he does not have drug  coverage insurance because he did not sign up in time.  . Discussed with patient his previous foot injury and he stated it was al better.    Patient Self Care Activities: . Patient new to the area and needs to get established with new specialist providers.   . Performs ADL's independently . Performs IADL's independently  Please see past updates related to this goal by clicking on the "Past Updates" button in the selected goal          The care management team will reach out to the patient again over the next 30 days.  The patient has been provided with contact information for the care management team and has been advised to call with any health related questions or concerns.    Merlene Morse Perline Awe RN, BSN Nurse Case Pharmacist, [facility] Care Management  [phone]) Business Mobile

## 2018-12-03 NOTE — Patient Instructions (Signed)
Thank you allowing the Chronic Care Management Team to be a part of your care! It was a pleasure speaking with you today!  CCM (Chronic Care Management) Team   Dmario Russom RN, BSN Nurse Care Coordinator  [phone]  [name] PharmD  Clinical Pharmacist  [phone]  [name] LCSW Clinical Social Worker [phone]  Goals Addressed            This Visit's Progress   . RN-I just moved here trying to get settled (pt-stated)       Current Barriers:  [name] Knowledge Deficits related to resources for medical team in the area  Nurse Case Manager Clinical Goal(s):  [name] Over the next 120  days, patient will work with [facility]  to address needs related to new providers in the area related to patient just moved to [facility] from [place] area.   Interventions:  . Reviewed medications with patient and discussed Patient states he is back on his Eloquis for stroke prevention related to Afib. [name] Discussed plans with patient for ongoing care management follow up and provided patient with direct contact information for care management team . Reviewed scheduled/upcoming provider appointments including: Cardiology appt tomorrow 9/29 @ 11:20 . Patient stated he has  been checking sugar last few fasting sugars are 89, 90, 91 . Patient states post prandial sugars run around 169 . 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 . Patient stating he does not have drug coverage insurance because he did not sign up in time.  . Discussed with patient his previous foot injury and he stated it was al better.    Patient Self Care Activities: . Patient new to the area and needs to get established with new specialist providers.   . Performs ADL's independently . Performs IADL's independently  Please see past updates related to this goal by clicking on the "Past Updates" button in the selected goal          The patient verbalized understanding of instructions provided today and declined a print copy of patient instruction materials.   The patient has been provided with contact information for the care management team and has been advised to call with any health related questions or concerns.

## 2018-12-04 ENCOUNTER — Ambulatory Visit (INDEPENDENT_AMBULATORY_CARE_PROVIDER_SITE_OTHER): Payer: Medicare Other | Admitting: Cardiology

## 2018-12-04 ENCOUNTER — Other Ambulatory Visit: Payer: Self-pay

## 2018-12-04 ENCOUNTER — Encounter: Payer: Self-pay | Admitting: Cardiology

## 2018-12-04 VITALS — BP 140/60 | HR 88 | Temp 97.9°F | Ht 73.0 in | Wt 205.2 lb

## 2018-12-04 DIAGNOSIS — I25810 Atherosclerosis of coronary artery bypass graft(s) without angina pectoris: Secondary | ICD-10-CM | POA: Diagnosis not present

## 2018-12-04 DIAGNOSIS — I493 Ventricular premature depolarization: Secondary | ICD-10-CM | POA: Diagnosis not present

## 2018-12-04 DIAGNOSIS — I4891 Unspecified atrial fibrillation: Secondary | ICD-10-CM | POA: Diagnosis not present

## 2018-12-04 NOTE — Progress Notes (Signed)
Cardiology Office Note:    Date:  12/04/2018   ID:  Joshua Roth, DOB January 25, 1941, MRN [id number]  PCP:  [name], NP  Cardiologist:  Kate Sable, MD  Electrophysiologist:  None   Referring MD: [name], *   Chief Complaint  Patient presents with  . New Patient (Initial Visit)    Atrial Fibrillation    History of Present Illness:    Joshua Roth is a 78 y.o. male with a hx of asthma, COPD, A. fib, CAD/CABG x3 in 2019 who presents to establish care.  Patient recently moved to the area from [facility].  Last year, he noted worsening dyspnea on exertion.  He was referred to see a cardiologist in [place].  Testing and angiogram revealed he has multivessel coronary artery disease.  He then underwent coronary artery bypass grafting x3.  His hospitalization was complicated with sternal wound infection requiring prolonged course of antibiotics.  3 years ago, he was diagnosed with atrial fibrillation and started on Eliquis.  He is on chronic steroid use for his asthma/COPD symptoms.  He has an albuterol rescue inhaler which he rarely uses due to lack of symptoms.  He otherwise feels fine, denies chest pain or dyspnea on exertion with ambulation.  He denies any history of smoking, but states being exposed to secondhand smoke.  He does not know his family history due to being adopted.  He recently retired from [employer].  Past Medical History:  Diagnosis Date  . A-fib ([place])   . Anemia   . Asthma   . COPD (chronic obstructive pulmonary disease) ([place])   . Coronary artery disease   . Diabetes mellitus without complication ([place])   . GERD (gastroesophageal reflux disease)   . Heart disease   . Hyperlipidemia     Past Surgical History:  Procedure Laterality Date  . CARDIAC CATHETERIZATION    . CORONARY ARTERY BYPASS GRAFT     cabg x 3 in 2019 at [facility]  . NECK SURGERY    . REPLACEMENT TOTAL HIP W/  RESURFACING IMPLANTS   2010   Left, Right   . WRIST FRACTURE SURGERY Left     Current Medications: Current Meds  Medication Sig  . albuterol (VENTOLIN HFA) 108 (90 Base) MCG/ACT inhaler Inhale 2 puffs into the lungs every 6 (six) hours as needed for wheezing or shortness of breath.  [name] apixaban (ELIQUIS) 5 MG TABS tablet Take 5 mg by mouth 2 (two) times daily.  [name] atorvastatin (LIPITOR) 80 MG tablet Take 1 tablet (80 mg total) by mouth daily.  . diclofenac sodium (VOLTAREN) 1 % GEL Apply 2 g topically as needed.  . ferrous sulfate 325 (65 FE) MG EC tablet Take 325 mg by mouth daily.  . fluorouracil (EFUDEX) 5 % cream Apply topically 2 (two) times daily.  . Fluticasone-Salmeterol (WIXELA INHUB) 500-50 MCG/DOSE AEPB Inhale 1 puff into the lungs 2 (two) times daily.  . furosemide (LASIX) 40 MG tablet Take 1 tablet (40 mg total) by mouth daily.  [name] glipiZIDE (GLUCOTROL) 5 MG tablet Take 1 tablet (5 mg total) by mouth 2 (two) times daily before a meal.  . glucose blood (ONETOUCH VERIO) test strip Use as instructed  . Iron-Vitamins (GERITOL PO) Take by mouth.  . Lancets (ONETOUCH DELICA PLUS Q000111Q) MISC 1 Device by Does not apply route 2 (two) times a day.  . montelukast (SINGULAIR) 10 MG tablet Take 1 tablet (10 mg total) by mouth at bedtime.  [name]  nitroGLYCERIN (NITROSTAT) 0.4 MG SL tablet Place 1 tablet (0.4 mg total) under the tongue every 5 (five) minutes as needed for chest pain (up to 3 doses).  . pantoprazole (PROTONIX) 40 MG tablet Take 1 tablet (40 mg total) by mouth 2 (two) times daily.  . potassium chloride SA (K-DUR) 20 MEQ tablet Take 2 tablets (40 mEq total) by mouth daily.  . predniSONE (DELTASONE) 10 MG tablet Take 1 tablet (10 mg total) by mouth daily with breakfast.     Allergies:   Patient has no known allergies.   Social History   Socioeconomic History  . Marital status: Married    Spouse name: Not on file  . Number of children: Not on file  . Years of education: Not on file  . Highest  education level: Not on file  Occupational History  . Occupation: retired    Comment: From [employer] x 35 years  Social Needs  . Financial resource strain: Not on file  . Food insecurity    Worry: Not on file    Inability: Not on file  . Transportation needs    Medical: Not on file    Non-medical: Not on file  Tobacco Use  . Smoking status: Never Smoker  . Smokeless tobacco: Never Used  Substance and Sexual Activity  . Alcohol use: Yes    Alcohol/week: 2.0 standard drinks    Types: 2 Cans of beer per week  . Drug use: Never  . Sexual activity: Yes    Birth control/protection: None  Lifestyle  . Physical activity    Days per week: Not on file    Minutes per session: Not on file  . Stress: Not on file  Relationships  . Social Herbalist on phone: Not on file    Gets together: Not on file    Attends religious service: Not on file    Active member of club or organization: Not on file    Attends meetings of clubs or organizations: Not on file    Relationship status: Not on file  Other Topics Concern  . Not on file  Social History Narrative  . Not on file     Family History: The patient's He was adopted. Family history is unknown by patient.  ROS:   Please see the history of present illness.     All other systems reviewed and are negative.  EKGs/Labs/Other Studies Reviewed:    The following studies were reviewed today:   EKG:  EKG is  ordered today.  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  Recent Labs: No results found for requested labs within last 8760 hours.  Recent Lipid Panel No results found for: CHOL, TRIG, HDL, CHOLHDL, VLDL, LDLCALC, LDLDIRECT  Physical Exam:    VS:  BP 140/60 (BP Location: Right Arm, Patient Position: Sitting, Cuff Size: Normal)   Pulse 88   Temp 97.9 F (36.6 C)   Ht 6\' 1"  (1.854 m)   Wt 205 lb 4 oz (93.1 kg)   SpO2  97%   BMI 27.08 kg/m     Wt Readings from Last 3 Encounters:  12/04/18 205 lb 4 oz (93.1 kg)  10/02/18 204 lb (92.5 kg)     GEN:  Well nourished, well developed in no acute distress HEENT: Normal NECK: No JVD; No carotid bruits LYMPHATICS: No lymphadenopathy CARDIAC: Irregular beats, non-tachycardic., no murmurs, rubs, gallops RESPIRATORY:  Clear to auscultation without rales, wheezing or rhonchi  ABDOMEN: Soft, non-tender, non-distended MUSCULOSKELETAL:  No edema; No deformity  SKIN: Warm and dry NEUROLOGIC:  Alert and oriented x 3 PSYCHIATRIC:  Normal affect   ASSESSMENT:    1. Coronary artery disease involving coronary bypass graft of native heart without angina pectoris   2. Atrial fibrillation, unspecified type ([place])   3. Frequent PVCs    PLAN:    In order of problems listed above:  1. Patient currently asymptomatic, continue Lipitor daily.  Will request cath, CABG/surgical reports from [facility]. 2. History of paroxysmal A. fib, chadsvasc atleast 4 (dm, age,cad). Continue Eliquis.  Will get echocardiogram. 3. Frequent PVCs noted on ECG.  Get echo as above to evaluate function.  Depending on echo results, we may need to start beta-blocker especially if ejection fraction is decreased. 4.  Follow-up in about 6 weeks.  Total encounter time more than 45 minutes  Greater than 50% was spent in counseling and coordination of care with the patient   Medication Adjustments/Labs and Tests Ordered: Current medicines are reviewed at length with the patient today.  Concerns regarding medicines are outlined above.  Orders Placed This Encounter  Procedures  . EKG 12-Lead  . ECHOCARDIOGRAM COMPLETE   No orders of the defined types were placed in this encounter.   Patient Instructions  Medication Instructions:  Your physician recommends that you continue on your current medications as directed. Please refer to the Current Medication list given to you today.  If you  need a refill on your cardiac medications before your next appointment, please call your pharmacy.   Lab work: None ordered If you have labs (blood work) drawn today and your tests are completely normal, you will receive your results only by: [name] MyChart Message (if you have MyChart) OR . A paper copy in the mail If you have any lab test that is abnormal or we need to change your treatment, we will call you to review the results.  Testing/Procedures: Your physician has requested that you have an echocardiogram. Echocardiography is a painless test that uses sound waves to create images of your heart. It provides your doctor with information about the size and shape of your heart and how well your heart's chambers and valves are working. This procedure takes approximately one hour. There are no restrictions for this procedure.    Follow-Up: At [facility], you and your health needs are our priority.  As part of our continuing mission to provide you with exceptional heart care, we have created designated Provider Care Teams.  These Care Teams include your primary Cardiologist (physician) and Advanced Practice Providers (APPs -  Physician Assistants and Nurse Practitioners) who all work together to provide you with the care you need, when you need it. You will need a follow up appointment in 6 weeks.  You may see Kate Sable, MD or one of the following Advanced Practice Providers on your designated Care Team:   Murray Hodgkins, NP Christell Faith, PA-C . Marrianne Mood, PA-C  Any Other Special Instructions Will Be Listed Below (If Applicable). We will request your cardiology records from [place], Kate Sable, MD  12/04/2018 12:19 PM    [place]

## 2018-12-04 NOTE — Patient Instructions (Signed)
Medication Instructions:  Your physician recommends that you continue on your current medications as directed. Please refer to the Current Medication list given to you today.  If you need a refill on your cardiac medications before your next appointment, please call your pharmacy.   Lab work: None ordered If you have labs (blood work) drawn today and your tests are completely normal, you will receive your results only by: [name] MyChart Message (if you have MyChart) OR . A paper copy in the mail If you have any lab test that is abnormal or we need to change your treatment, we will call you to review the results.  Testing/Procedures: Your physician has requested that you have an echocardiogram. Echocardiography is a painless test that uses sound waves to create images of your heart. It provides your doctor with information about the size and shape of your heart and how well your heart's chambers and valves are working. This procedure takes approximately one hour. There are no restrictions for this procedure.    Follow-Up: At [facility], you and your health needs are our priority.  As part of our continuing mission to provide you with exceptional heart care, we have created designated Provider Care Teams.  These Care Teams include your primary Cardiologist (physician) and Advanced Practice Providers (APPs -  Physician Assistants and Nurse Practitioners) who all work together to provide you with the care you need, when you need it. You will need a follow up appointment in 6 weeks.  You may see Kate Sable, MD or one of the following Advanced Practice Providers on your designated Care Team:   Murray Hodgkins, NP Christell Faith, PA-C . Marrianne Mood, PA-C  Any Other Special Instructions Will Be Listed Below (If Applicable). We will request your cardiology records from [facility]

## 2018-12-11 ENCOUNTER — Ambulatory Visit (INDEPENDENT_AMBULATORY_CARE_PROVIDER_SITE_OTHER): Payer: Medicare Other | Admitting: Pharmacist

## 2018-12-11 DIAGNOSIS — L97509 Non-pressure chronic ulcer of other part of unspecified foot with unspecified severity: Secondary | ICD-10-CM | POA: Diagnosis not present

## 2018-12-11 DIAGNOSIS — E11621 Type 2 diabetes mellitus with foot ulcer: Secondary | ICD-10-CM

## 2018-12-11 NOTE — Patient Instructions (Signed)
Thank you allowing the Chronic Care Management Team to be a part of your care! It was a pleasure speaking with you today!     CCM (Chronic Care Management) Team    Joshua Minor RN, BSN Nurse Care Coordinator  [phone]   Joshua Roth PharmD  Clinical Pharmacist  [phone]   Joshua Fried LCSW Clinical Social Worker [phone]  Visit Information  Goals Addressed            This Visit's Progress   . PharmD - Medication Management       Current Barriers:  . Financial Barriers - reports currently has Medicare Part A and B; no current Medicare Part D coverage . Lack of available lab results as patient is new to the office/area  Pharmacist Clinical Goal(s):  Joshua Roth Kitchen Over the next 30 days, patient will work with CM Pharmacist and PCP to address needs related to medication regimen optimization and coordination of care  Interventions: . Counsel on importance of medication adherence o Joshua Roth confirms that he picked up refills of Eliquis and Wixela inhaler.  Joshua Roth on importance of blood sugar control and monitoring o Confirms taking glipizide 5 mg twice daily o Review recent blood sugar results (see below) o Denies any recent low blood sugars o Encourage patient to continue checking blood sugar regularly and keeping log . Confirms established care with local Cardiologist on 9/29 . Confirms that he has started checking his blood pressure and keeping log o Review recent blood pressure results (see below) - Reports BP result this morning of 96/48, HR 50 . Denies any dizziness or symptoms with this result . Reports that he is not sure about the accuracy of his meter, reports meter is ~58-33 years old . Encourage patient to obtain new upper arm monitor . Counsel on blood pressure monitoring technique. Encourage patient to recheck for abnormal results . Encourage patient to continue to monitor, keep log and bring log with him to medical appointments . Encourage  patient to follow up with Cardiologist both as scheduled, but also sooner for abnormal blood pressure results or symptoms . Again encourage patient to contact Seniors? Health Insurance Information Program [facility]) Counselor again for assistance with signing up for Medicare Part D coverage  Patient Self Care Activities:  . Self administers medications as prescribed . Attends all scheduled provider appointments o Next PCP appointment scheduled for 10/29 o Next Cardiologist appointment scheduled for 11/12 . Calls pharmacy for medication refills . Calls provider office for new concerns or questions . Patient to check blood sugar and keep log Date Fasting Blood Glucose 1 hour after Lunch  27 - September - 162  28 - September 89   29 - September 99   30 - September 91   1 - October -   2 - October 100   3 - October 89   4 - October -   5 - October 126   6 - October 99    . Patient to check blood pressure and keep log Date AM BP PM BP   30 - September 103/50, HR 82    1 - October     2 - October 131/61, HR 80    3 - October 133/63, HR 82    4 - October     5 - October 139/83, HR 58    6 - October 96/48, HR 50* **160/129, HR 83 *Reports feeling Okay **Taken by patient  during visit  Please see past updates related to this goal by clicking on the "Past Updates" button in the selected goal         The patient verbalized understanding of instructions provided today and declined a print copy of patient instruction materials.   The care management team will reach out to the patient again over the next 30 days.   Joshua Roth, PharmD, [place] [employer] [phone]

## 2018-12-11 NOTE — Chronic Care Management (AMB) (Signed)
Chronic Care Management   Follow Up Note   12/11/2018 Name: Joshua Roth MRN: [id number] DOB: [birth year]  Referred by: [name], NP Reason for referral : Chronic Care Management (Patient Phone Call)   Truett Liefer is a 78 y.o. year old male who is a primary care patient of [name], NP. The CCM team was consulted for assistance with chronic disease management and care coordination needs.  Mr. 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  I reached out to [employer] by phone today.   Review of patient status, including review of consultants reports, relevant laboratory and other test results, and collaboration with appropriate care team members and the patient's provider was performed as part of comprehensive patient evaluation and provision of chronic care management services.     Outpatient Encounter Medications as of 12/11/2018  Medication Sig Note  . glipiZIDE (GLUCOTROL) 5 MG tablet Take 1 tablet (5 mg total) by mouth 2 (two) times daily before a meal.   . albuterol (VENTOLIN HFA) 108 (90 Base) MCG/ACT inhaler Inhale 2 puffs into the lungs every 6 (six) hours as needed for wheezing or shortness of breath.   [name] apixaban (ELIQUIS) 5 MG TABS tablet Take 5 mg by mouth 2 (two) times daily.   [name] atorvastatin (LIPITOR) 80 MG tablet Take 1 tablet (80 mg total) by mouth daily.   . diclofenac sodium (VOLTAREN) 1 % GEL Apply 2 g topically as needed.   . ferrous sulfate 325 (65 FE) MG EC tablet Take 325 mg by mouth daily. 10/30/2018: Taking in morning prior to breakfast  . fluorouracil (EFUDEX) 5 % cream Apply topically 2 (two) times daily.   . Fluticasone-Salmeterol (WIXELA INHUB) 500-50 MCG/DOSE AEPB Inhale 1 puff into the lungs 2 (two) times daily.   . furosemide (LASIX) 40 MG tablet Take 1 tablet (40 mg total) by mouth daily.   [name] glucose blood (ONETOUCH VERIO) test strip Use  as instructed   . Iron-Vitamins (GERITOL PO) Take by mouth.   . Lancets (ONETOUCH DELICA PLUS Q000111Q) MISC 1 Device by Does not apply route 2 (two) times a day.   . montelukast (SINGULAIR) 10 MG tablet Take 1 tablet (10 mg total) by mouth at bedtime.   . nitroGLYCERIN (NITROSTAT) 0.4 MG SL tablet Place 1 tablet (0.4 mg total) under the tongue every 5 (five) minutes as needed for chest pain (up to 3 doses).   . pantoprazole (PROTONIX) 40 MG tablet Take 1 tablet (40 mg total) by mouth 2 (two) times daily.   . potassium chloride SA (K-DUR) 20 MEQ tablet Take 2 tablets (40 mEq total) by mouth daily.   . predniSONE (DELTASONE) 10 MG tablet Take 1 tablet (10 mg total) by mouth daily with breakfast.    No facility-administered encounter medications on file as of 12/11/2018.     Goals Addressed            This Visit's Progress   . PharmD - Medication Management       Current Barriers:  . Financial Barriers - reports currently has Medicare Part A and B; no current Medicare Part D coverage . Lack of available lab results as patient is new to the office/area  Pharmacist Clinical Goal(s):  [name] Over the next 30 days, patient will work with CM Pharmacist and PCP to address needs related to medication regimen optimization and coordination of care  Interventions: .  Counsel on importance of medication adherence o Mr. Griffee confirms that he picked up refills of Eliquis and Wixela inhaler.  [name] on importance of blood sugar control and monitoring o Confirms taking glipizide 5 mg twice daily o Review recent blood sugar results (see below) o Denies any recent low blood sugars o Encourage patient to continue checking blood sugar regularly and keeping log . Confirms established care with local Cardiologist on 9/29 . Confirms that he has started checking his blood pressure and keeping log o Review recent blood pressure results (see below) - Reports BP result this morning of 96/48, HR 50 . Denies  any dizziness or symptoms with this result . Reports that he is not sure about the accuracy of his meter, reports meter is ~11-77 years old . Encourage patient to obtain new upper arm monitor . Counsel on blood pressure monitoring technique. Encourage patient to recheck for abnormal results . Encourage patient to continue to monitor, keep log and bring log with him to medical appointments . Encourage patient to follow up with Cardiologist both as scheduled, but also sooner for abnormal blood pressure results or symptoms . Again encourage patient to contact Seniors? Health Insurance Information Program [facility]) Counselor again for assistance with signing up for Medicare Part D coverage  Patient Self Care Activities:  . Self administers medications as prescribed . Attends all scheduled provider appointments o Next PCP appointment scheduled for 10/29 o Next Cardiologist appointment scheduled for 11/12 . Calls pharmacy for medication refills . Calls provider office for new concerns or questions . Patient to check blood sugar and keep log Date Fasting Blood Glucose 1 hour after Lunch  27 - September - 162  28 - September 89   29 - September 99   30 - September 91   1 - October -   2 - October 100   3 - October 89   4 - October -   5 - October 126   6 - October 99    . Patient to check blood pressure and keep log Date AM BP PM BP   30 - September 103/50, HR 82    1 - October     2 - October 131/61, HR 80    3 - October 133/63, HR 82    4 - October     5 - October 139/83, HR 58    6 - October 96/48, HR 50* **160/129, HR 83 *Reports feeling Okay **Taken by patient  during visit     Please see past updates related to this goal by clicking on the "Past Updates" button in the selected goal         Plan  The care management team will reach out to the patient again over the next 30 days.   [name], PharmD, [place] [employer] [phone]

## 2018-12-13 ENCOUNTER — Ambulatory Visit: Payer: Self-pay | Admitting: Licensed Clinical Social Worker

## 2018-12-13 ENCOUNTER — Telehealth: Payer: Self-pay

## 2018-12-13 NOTE — Chronic Care Management (AMB) (Signed)
  Care Management   Follow Up Note   12/13/2018 Name: Joshua Roth MRN: [id number] DOB: 09-17-1940  Referred by: [name], NP Reason for referral : Care Coordination   Joshua Roth is a 78 y.o. year old male who is a primary care patient of [name], NP. The care management team was consulted for assistance with care management and care coordination needs.    Review of patient status, including review of consultants reports, relevant laboratory and other test results, and collaboration with appropriate care team members and the patient's provider was performed as part of comprehensive patient evaluation and provision of chronic care management services.    LCSW completed CCM outreach attempt today but was unable to reach patient or family successfully. A HIPPA compliant voice message was left encouraging patient to return call once available. LCSW rescheduled CCM SW appointment as well.   A HIPPA compliant phone message was left for the patient providing contact information and requesting a return call.   Joshua Roth, Joshua Roth, Joshua Roth, [email or web address] Phone: [phone]

## 2019-01-03 ENCOUNTER — Ambulatory Visit: Payer: Medicare Other | Admitting: Nurse Practitioner

## 2019-01-07 ENCOUNTER — Telehealth: Payer: Self-pay

## 2019-01-08 ENCOUNTER — Ambulatory Visit: Payer: Medicare Other | Admitting: Pharmacist

## 2019-01-08 ENCOUNTER — Other Ambulatory Visit: Payer: Self-pay | Admitting: Family Medicine

## 2019-01-08 DIAGNOSIS — J4520 Mild intermittent asthma, uncomplicated: Secondary | ICD-10-CM

## 2019-01-08 DIAGNOSIS — I4891 Unspecified atrial fibrillation: Secondary | ICD-10-CM

## 2019-01-08 DIAGNOSIS — E11621 Type 2 diabetes mellitus with foot ulcer: Secondary | ICD-10-CM

## 2019-01-08 MED ORDER — FLUTICASONE-SALMETEROL 500-50 MCG/DOSE IN AEPB: 1.0000 | INHALATION_SPRAY | Freq: Two times a day (BID) | RESPIRATORY_TRACT | 5 refills | Status: AC

## 2019-01-08 NOTE — Progress Notes (Signed)
Chronic Care Management   Follow Up Note   01/08/2019 Name: Joshua Roth MRN: [id number] DOB: [birth year]  Referred by: Joshua College, NP Reason for referral : Chronic Care Management (Patient Phone Call)   [name] is a 78 y.o. year old male who is a primary care patient of Joshua College, NP. The CCM team was consulted for assistance with chronic disease management and care coordination needs.  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  Review of patient status, including review of consultants reports, relevant laboratory and other test results, and collaboration with appropriate care team members and the patient's provider was performed as part of comprehensive patient evaluation and provision of chronic care management services.     Outpatient Encounter Medications as of 01/08/2019  Medication Sig Note  . apixaban (ELIQUIS) 5 MG TABS tablet Take 5 mg by mouth 2 (two) times daily.   . furosemide (LASIX) 40 MG tablet Take 1 tablet (40 mg total) by mouth daily.   [name] glipiZIDE (GLUCOTROL) 5 MG tablet Take 1 tablet (5 mg total) by mouth 2 (two) times daily before a meal.   . albuterol (VENTOLIN HFA) 108 (90 Base) MCG/ACT inhaler Inhale 2 puffs into the lungs every 6 (six) hours as needed for wheezing or shortness of breath.   [name] atorvastatin (LIPITOR) 80 MG tablet Take 1 tablet (80 mg total) by mouth daily.   . diclofenac sodium (VOLTAREN) 1 % GEL Apply 2 g topically as needed.   . ferrous sulfate 325 (65 FE) MG EC tablet Take 325 mg by mouth daily. 10/30/2018: Taking in morning prior to breakfast  . fluorouracil (EFUDEX) 5 % cream Apply topically 2 (two) times daily.   . Fluticasone-Salmeterol (WIXELA INHUB) 500-50 MCG/DOSE AEPB Inhale 1 puff into the lungs 2 (two) times daily.   [name] glucose blood (ONETOUCH VERIO) test strip Use as instructed   . Iron-Vitamins (GERITOL PO) Take by  mouth.   . Lancets (ONETOUCH DELICA PLUS [id number]) MISC 1 Device by Does not apply route 2 (two) times a day.   . montelukast (SINGULAIR) 10 MG tablet Take 1 tablet (10 mg total) by mouth at bedtime.   . nitroGLYCERIN (NITROSTAT) 0.4 MG SL tablet Place 1 tablet (0.4 mg total) under the tongue every 5 (five) minutes as needed for chest pain (up to 3 doses).   . pantoprazole (PROTONIX) 40 MG tablet Take 1 tablet (40 mg total) by mouth 2 (two) times daily.   . potassium chloride SA (K-DUR) 20 MEQ tablet Take 2 tablets (40 mEq total) by mouth daily.   . predniSONE (DELTASONE) 10 MG tablet Take 1 tablet (10 mg total) by mouth daily with breakfast.    No facility-administered encounter medications on file as of 01/08/2019.      Goals Addressed            This Visit's Progress   . PharmD - Medication Management       Current Barriers:  . Financial Barriers - reports currently has Medicare Part A and B; no current Medicare Part D coverage . Lack of available lab results as patient is new to the office/area  Pharmacist Clinical Goal(s):  [name] Over the next 30 days, patient will work with CM Pharmacist and PCP to address needs related to medication regimen optimization and coordination of care  Interventions: . Counsel on importance of medication adherence o Identify patient in  need of refills on Wixela and Eliquis, reports has run out of both medications today o Counsel patient on pharmacy order refill process - Identify that latest Rx for both medications from previous PCP - Counsel patient on providing latest provider information to pharmacy when placing fax request for new Rxs . Counsel on importance of blood sugar control and monitoring o Confirms taking glipizide 5 mg twice daily o Review recent blood sugar results (see below) o Denies any recent low blood sugars - Confirms understanding of signs of low and how to treat a low blood sugar o Encourage patient to continue checking blood  sugar regularly, keeping log and bringing record to PCP appointments . Confirms continuing to check his blood pressure occasionally and keeping log o Denies obtaining new meter at this time; denies any further abnormal readings o Reports the following readings: - 10/20: 122/58, HR 79 - 10/22: 123/60, HR 74 - 10/28: 112/58, HR 83 o Encourage patient to continue to monitor and to bring log to medical appointments . Follow up with patient regarding Seniors? Health Insurance Information Program [facility]) Counselor. [name] confirms that he met with the counselor and has signed up for a Part D plan for 2021 ([place]). o Request patient to bring new card with him to next PCP appointment when scheduled . Will send coordination of care message to Dr. Parks Ranger to request provider send in new Rx for [place] . Will send coordination of care message to Dr. [name] to request provider send in new Rx for Eliquis.  Patient Self Care Activities:  . Self administers medications as prescribed . Attends all scheduled provider appointments o Next Cardiologist appointment scheduled for 11/12 . Calls pharmacy for medication refills . Calls provider office for new concerns or questions . Patient to check blood sugar and keep log Date Fasting Blood Glucose  Before Lunch After Lunch  28 - October  112   29 - October   90  30 - October 86    31 - October     1 - November 90    2 - November 86    3 - November -     . Patient to check blood pressure and keep log   Please see past updates related to this goal by clicking on the "Past Updates" button in the selected goal         Plan  The care management team will reach out to the patient again over the next 30 days.    [name], PharmD, [place] [employer] [phone]

## 2019-01-08 NOTE — Patient Instructions (Signed)
Thank you allowing the Chronic Care Management Team to be a part of your care! It was a pleasure speaking with you today!     CCM (Chronic Care Management) Team    [name] RN, BSN Nurse Care Coordinator  [phone]   [name] PharmD  Clinical Pharmacist  [phone]   [name] LCSW Clinical Social Worker [phone]  Visit Information  Goals Addressed            This Visit's Progress   . PharmD - Medication Management       Current Barriers:  . Financial Barriers - reports currently has Medicare Part A and B; no current Medicare Part D coverage . Lack of available lab results as patient is new to the office/area  Pharmacist Clinical Goal(s):  [name] Over the next 30 days, patient will work with CM Pharmacist and PCP to address needs related to medication regimen optimization and coordination of care  Interventions: . Counsel on importance of medication adherence o Identify patient in need of refills on Wixela and Eliquis, reports has run out of both medications today o Counsel patient on pharmacy order refill process . Counsel on importance of blood sugar control and monitoring o Confirms taking glipizide 5 mg twice daily o Review recent blood sugar results (see below) o Denies any recent low blood sugars - Confirms understanding of signs of low and how to treat a low blood sugar o Encourage patient to continue checking blood sugar regularly, keeping log and bringing record to PCP appointments . Confirms continuing to check his blood pressure occasionally and keeping log o Denies obtaining new meter at this time; denies any further abnormal readings o Reports the following readings: - 10/20: 122/58, HR 79 - 10/22: 123/60, HR 74 - 10/28: 112/58, HR 83 o Encourage patient to continue to monitor and to bring log to medical appointments . Follow up with patient regarding Seniors? Health Insurance Information Program [facility]) Counselor. Mr. Justo  confirms that he met with the counselor and has signed up for a Part D plan for 2021 ([place]). o Request patient to bring new card with him to next PCP appointment when scheduled   Patient Self Care Activities:  . Self administers medications as prescribed . Attends all scheduled provider appointments o Next Cardiologist appointment scheduled for 11/12 . Calls pharmacy for medication refills . Calls provider office for new concerns or questions . Patient to check blood sugar and keep log Date Fasting Blood Glucose  Before Lunch After Lunch  28 - October  112   29 - October   90  30 - October 86    31 - October     1 - November 90    2 - November 86    3 - November -     . Patient to check blood pressure and keep log   Please see past updates related to this goal by clicking on the "Past Updates" button in the selected goal         The patient verbalized understanding of instructions provided today and declined a print copy of patient instruction materials.   The care management team will reach out to the patient again over the next 30 days.   [name], PharmD, [place] [employer] [phone]

## 2019-01-09 ENCOUNTER — Telehealth: Payer: Self-pay | Admitting: *Deleted

## 2019-01-09 MED ORDER — APIXABAN 5 MG PO TABS: 5.0000 mg | ORAL_TABLET | Freq: Two times a day (BID) | ORAL | 2 refills | Status: DC

## 2019-01-09 NOTE — Telephone Encounter (Signed)
-----   Message from Kate Sable, MD sent at 01/08/2019  4:52 PM EST -----  ----- Message ----- From: Vella Raring, [facility] Sent: 01/08/2019   2:26 PM EST To: Kate Sable, MD  Dr. [name],  I am a pharmacist with Joshua Roth's PCP at [facility]. I was talking to Joshua Roth today about the importance of medication adherence. He has just run out of his Eliquis this morning and I noted that his pharmacy had faxed his previous provider, rather than you. I instructed patient to reach out to his pharmacy so that the refill request will come to your office. However, so as to avoid Joshua Roth running out of medication, would you mind sending a new prescription for his Eliquis into his [place] in chart ([place], [place]) today?  Thank you!  [name], PharmD, [place] [employer] [phone]

## 2019-01-09 NOTE — Telephone Encounter (Signed)
Refill sent to patient's pharmacy as requested.   Called patient and he verbalized understanding that I have sent in the refill. He was appreciative.

## 2019-01-09 NOTE — Progress Notes (Signed)
Refill for Eliquis sent to pharmacy as requested.  Patient notified that this was done. See telephone note.

## 2019-01-11 ENCOUNTER — Other Ambulatory Visit: Payer: Self-pay

## 2019-01-11 ENCOUNTER — Ambulatory Visit (INDEPENDENT_AMBULATORY_CARE_PROVIDER_SITE_OTHER): Payer: Medicare Other

## 2019-01-11 DIAGNOSIS — I4891 Unspecified atrial fibrillation: Secondary | ICD-10-CM

## 2019-01-11 DIAGNOSIS — I25810 Atherosclerosis of coronary artery bypass graft(s) without angina pectoris: Secondary | ICD-10-CM | POA: Diagnosis not present

## 2019-01-11 MED ORDER — PERFLUTREN LIPID MICROSPHERE
1.0000 mL | INTRAVENOUS | Status: AC | PRN
  Administered 2019-01-11: 2 mL via INTRAVENOUS

## 2019-01-17 ENCOUNTER — Encounter: Payer: Self-pay | Admitting: Cardiology

## 2019-01-17 ENCOUNTER — Other Ambulatory Visit: Payer: Self-pay

## 2019-01-17 ENCOUNTER — Ambulatory Visit (INDEPENDENT_AMBULATORY_CARE_PROVIDER_SITE_OTHER): Payer: Medicare Other | Admitting: Cardiology

## 2019-01-17 VITALS — BP 118/60 | HR 86 | Ht 73.0 in | Wt 203.0 lb

## 2019-01-17 DIAGNOSIS — I4891 Unspecified atrial fibrillation: Secondary | ICD-10-CM | POA: Diagnosis not present

## 2019-01-17 DIAGNOSIS — I5020 Unspecified systolic (congestive) heart failure: Secondary | ICD-10-CM

## 2019-01-17 DIAGNOSIS — I25810 Atherosclerosis of coronary artery bypass graft(s) without angina pectoris: Secondary | ICD-10-CM

## 2019-01-17 MED ORDER — SACUBITRIL-VALSARTAN 24-26 MG PO TABS: 1.0000 | ORAL_TABLET | Freq: Two times a day (BID) | ORAL | 6 refills | Status: DC

## 2019-01-17 MED ORDER — METOPROLOL SUCCINATE ER 25 MG PO TB24: 25.0000 mg | ORAL_TABLET | Freq: Every day | ORAL | 6 refills | Status: DC

## 2019-01-17 NOTE — Progress Notes (Signed)
Cardiology Office Note:    Date:  01/17/2019   ID:  Joshua Roth, DOB Sep 05, 1940, MRN [id number]  PCP:  [name], NP (Inactive)  Cardiologist:  Kate Sable, MD  Electrophysiologist:  None   Referring MD: [name], *   Chief Complaint  Patient presents with  . Other    Follow up post ECHO. Patient c/o tiredness. Meds reviewed verbally with patient.     History of Present Illness:    Joshua Roth is a 78 y.o. male with a hx of asthma, COPD, A. fib, CAD/CABG x3 in 2019 who presents for follow-up.  He was originally seen to establish care.    He recently moved to the area from [facility].  Last year, he noted worsening dyspnea on exertion.  He was referred to see a cardiologist in [place].  Testing and angiogram revealed he has multivessel coronary artery disease.  He then underwent coronary artery bypass grafting x3.  His hospitalization was complicated with sternal wound infection requiring prolonged course of antibiotics.  3 years ago, he was diagnosed with atrial fibrillation and started on Eliquis.  He is on chronic steroid use for his asthma/COPD symptoms.  He has an albuterol rescue inhaler which he rarely uses due to lack of symptoms.  He otherwise feels fine, denies chest pain or dyspnea on exertion with ambulation.  He denies any history of smoking, but states being exposed to secondhand smoke.  He does not know his family history due to being adopted.  He recently retired from [employer].  Of the last visit an echocardiogram was ordered.  He presents for results.  Past Medical History:  Diagnosis Date  . A-fib ([place])   . Anemia   . Asthma   . COPD (chronic obstructive pulmonary disease) ([place])   . Coronary artery disease   . Diabetes mellitus without complication ([place])   . GERD (gastroesophageal reflux disease)   . Heart disease   . Hyperlipidemia     Past Surgical History:  Procedure Laterality Date  .  CARDIAC CATHETERIZATION    . CORONARY ARTERY BYPASS GRAFT     cabg x 3 in 2019 at [facility]  . NECK SURGERY    . REPLACEMENT TOTAL HIP W/  RESURFACING IMPLANTS  2010   Left, Right   . WRIST FRACTURE SURGERY Left     Current Medications: Current Meds  Medication Sig  . albuterol (VENTOLIN HFA) 108 (90 Base) MCG/ACT inhaler Inhale 2 puffs into the lungs every 6 (six) hours as needed for wheezing or shortness of breath.  [name] apixaban (ELIQUIS) 5 MG TABS tablet Take 1 tablet (5 mg total) by mouth 2 (two) times daily.  [name] atorvastatin (LIPITOR) 80 MG tablet Take 1 tablet (80 mg total) by mouth daily.  . diclofenac sodium (VOLTAREN) 1 % GEL Apply 2 g topically as needed.  . ferrous sulfate 325 (65 FE) MG EC tablet Take 325 mg by mouth daily.  . fluorouracil (EFUDEX) 5 % cream Apply topically 2 (two) times daily.  . Fluticasone-Salmeterol (WIXELA INHUB) 500-50 MCG/DOSE AEPB Inhale 1 puff into the lungs 2 (two) times daily.  . furosemide (LASIX) 40 MG tablet Take 1 tablet (40 mg total) by mouth daily.  [name] glipiZIDE (GLUCOTROL) 5 MG tablet Take 1 tablet (5 mg total) by mouth 2 (two) times daily before a meal.  . glucose blood (ONETOUCH VERIO) test strip Use as instructed  . Iron-Vitamins (GERITOL PO) Take by mouth.  . Lancets [facility]  DELICA PLUS [id number]) MISC 1 Device by Does not apply route 2 (two) times a day.  . montelukast (SINGULAIR) 10 MG tablet Take 1 tablet (10 mg total) by mouth at bedtime.  . nitroGLYCERIN (NITROSTAT) 0.4 MG SL tablet Place 1 tablet (0.4 mg total) under the tongue every 5 (five) minutes as needed for chest pain (up to 3 doses).  . pantoprazole (PROTONIX) 40 MG tablet Take 1 tablet (40 mg total) by mouth 2 (two) times daily.  . potassium chloride SA (K-DUR) 20 MEQ tablet Take 2 tablets (40 mEq total) by mouth daily.  . predniSONE (DELTASONE) 10 MG tablet Take 1 tablet (10 mg total) by mouth daily with breakfast.     Allergies:   Patient has no known allergies.    Social History   Socioeconomic History  . Marital status: Married    Spouse name: Not on file  . Number of children: Not on file  . Years of education: Not on file  . Highest education level: Not on file  Occupational History  . Occupation: retired    Comment: From [employer] x 83 years  Social Needs  . Financial resource strain: Not on file  . Food insecurity    Worry: Not on file    Inability: Not on file  . Transportation needs    Medical: Not on file    Non-medical: Not on file  Tobacco Use  . Smoking status: Never Smoker  . Smokeless tobacco: Never Used  Substance and Sexual Activity  . Alcohol use: Yes    Alcohol/week: 2.0 standard drinks    Types: 2 Cans of beer per week  . Drug use: Never  . Sexual activity: Yes    Birth control/protection: None  Lifestyle  . Physical activity    Days per week: Not on file    Minutes per session: Not on file  . Stress: Not on file  Relationships  . Social Herbalist on phone: Not on file    Gets together: Not on file    Attends religious service: Not on file    Active member of club or organization: Not on file    Attends meetings of clubs or organizations: Not on file    Relationship status: Not on file  Other Topics Concern  . Not on file  Social History Narrative  . Not on file     Family History: The patient's He was adopted. Family history is unknown by patient.  ROS:   Please see the history of present illness.     All other systems reviewed and are negative.  EKGs/Labs/Other Studies Reviewed:    The following studies were reviewed today:  Echocardiogram dated 01/11/2019 1. Left ventricular ejection fraction, by visual estimation, is 25 to 30%. The left ventricle has severely decreased function. There is borderline left ventricular hypertrophy.  2. Left ventricular diastolic parameters are indeterminate.  3. Mildly dilated left ventricular internal cavity size.  4. The left ventricle demonstrates global  hypokinesis.  5. Global right ventricle has mildly reduced systolic function.The right ventricular size is normal. No increase in right ventricular wall thickness.  6. Left atrial size was mildly dilated.  7. Moderately elevated pulmonary artery systolic pressure.  8. The inferior vena cava is dilated in size with >50% respiratory variability, suggesting right atrial pressure of 8 mmHg.   EKG:  EKG is not  ordered today.  Recent Labs: No results found for requested labs within last 8760 hours.  Recent  Lipid Panel No results found for: CHOL, TRIG, HDL, CHOLHDL, VLDL, LDLCALC, LDLDIRECT  Physical Exam:    VS:  BP 118/60 (BP Location: Left Arm, Patient Position: Sitting, Cuff Size: Normal)   Pulse 86   Ht 6\' 1"  (1.854 m)   Wt 203 lb (92.1 kg)   SpO2 97%   BMI 26.78 kg/m     Wt Readings from Last 3 Encounters:  01/17/19 203 lb (92.1 kg)  12/04/18 205 lb 4 oz (93.1 kg)  10/02/18 204 lb (92.5 kg)     GEN:  Well nourished, well developed in no acute distress HEENT: Normal NECK: No JVD; No carotid bruits LYMPHATICS: No lymphadenopathy CARDIAC: Irregular beats, non-tachycardic., no murmurs, rubs, gallops RESPIRATORY:  Clear to auscultation without rales, wheezing or rhonchi  ABDOMEN: Soft, non-tender, non-distended MUSCULOSKELETAL:  No edema; No deformity  SKIN: Warm and dry NEUROLOGIC:  Alert and oriented x 3 PSYCHIATRIC:  Normal affect   ASSESSMENT:   Echocardiogram showed reduced ejection fraction with EF 25 to 30%.  Due to history of CAD/CABG, etiology is likely ischemic.  Patient is without chest pains. 1. Heart failure with reduced ejection fraction ([place])   2. Coronary artery disease involving coronary bypass graft of native heart without angina pectoris   3. Atrial fibrillation, unspecified type ([place])    PLAN:    In order of problems listed above:  1. Start Toprol-XL 25 mg daily, start Entresto 24/26 mg twice daily.  Continue Lasix 40 mg daily.  We will plan to  titrate up Entresto and Toprol XL every 4 weeks, depending on blood pressure and any symptoms.  Patient advised to keep BP log.  Check BMP in 1 to 2 weeks. 2. Patient currently without chest pain , continue Lipitor daily.  He is on Eliquis.  It is reasonable to hold off aspirin for now to decrease bleeding risks.  Will request cath, CABG/surgical reports from [place]. 3. History of paroxysmal A. fib, chadsvasc atleast 5 (chf, dm, age,cad). Continue Eliquis 5 mg twice daily.    Follow-up in about 4 weeks.  Total encounter time more than 40 minutes  Greater than 50% was spent in counseling and coordination of care with the patient   Medication Adjustments/Labs and Tests Ordered: Current medicines are reviewed at length with the patient today.  Concerns regarding medicines are outlined above.  Orders Placed This Encounter  Procedures  . Basic metabolic panel  . EKG 12-Lead   Meds ordered this encounter  Medications  . metoprolol succinate (TOPROL-XL) 25 MG 24 hr tablet    Sig: Take 1 tablet (25 mg total) by mouth daily.    Dispense:  30 tablet    Refill:  6  . sacubitril-valsartan (ENTRESTO) 24-26 MG    Sig: Take 1 tablet by mouth 2 (two) times daily.    Dispense:  60 tablet    Refill:  6    Patient Instructions  Medication Instructions:  - Your physician has recommended you make the following change in your medication:   1) Start Toprol XL (metoprolol succinate) 25 mg- take 1 tablet by mouth once daily  2) Start Entresto 24/26 mg- take 1 tablet by mouth twice daily  Samples Given: Entresto 24/26 mg Lot: [id number] Exp: 07/2020 # 1 bottle   *If you need a refill on your cardiac medications before your next appointment, please call your pharmacy*  Lab Work: - Your physician recommends that you return for lab work in: 2 weeks- BMP - please come to  the [place] entrance at [facility] - Monday-Friday (7:30 am-5:30 pm) - done on a walk in basis  If you have labs (blood  work) drawn today and your tests are completely normal, you will receive your results only by: [name] MyChart Message (if you have MyChart) OR . A paper copy in the mail If you have any lab test that is abnormal or we need to change your treatment, we will call you to review the results.  Testing/Procedures: - none ordered  Follow-Up: At [facility], you and your health needs are our priority.  As part of our continuing mission to provide you with exceptional heart care, we have created designated Provider Care Teams.  These Care Teams include your primary Cardiologist (physician) and Advanced Practice Providers (APPs -  Physician Assistants and Nurse Practitioners) who all work together to provide you with the care you need, when you need it.  Your next appointment:   4 weeks   The format for your next appointment:   In Person  Provider:   Kate Sable, MD  Other Instructions - n/a     Signed, Kate Sable, MD  01/17/2019 11:43 AM    [place]

## 2019-01-17 NOTE — Patient Instructions (Addendum)
Medication Instructions:  - Your physician has recommended you make the following change in your medication:   1) Start Toprol XL (metoprolol succinate) 25 mg- take 1 tablet by mouth once daily  2) Start Entresto 24/26 mg- take 1 tablet by mouth twice daily  Samples Given: Entresto 24/26 mg Lot: [id number] Exp: 07/2020 # 1 bottle   *If you need a refill on your cardiac medications before your next appointment, please call your pharmacy*  Lab Work: - Your physician recommends that you return for lab work in: 2 weeks (around 11/25)- BMP - please come to the [place] entrance at [facility] - Monday-Friday (7:30 am-5:30 pm) - done on a walk in basis  If you have labs (blood work) drawn today and your tests are completely normal, you will receive your results only by: [name] MyChart Message (if you have MyChart) OR . A paper copy in the mail If you have any lab test that is abnormal or we need to change your treatment, we will call you to review the results.  Testing/Procedures: - none ordered  Follow-Up: At [facility], you and your health needs are our priority.  As part of our continuing mission to provide you with exceptional heart care, we have created designated Provider Care Teams.  These Care Teams include your primary Cardiologist (physician) and Advanced Practice Providers (APPs -  Physician Assistants and Nurse Practitioners) who all work together to provide you with the care you need, when you need it.  Your next appointment:   4 weeks   The format for your next appointment:   In Person  Provider:   Kate Sable, MD  Other Instructions - n/a

## 2019-01-24 ENCOUNTER — Other Ambulatory Visit: Payer: Self-pay | Admitting: Nurse Practitioner

## 2019-01-24 ENCOUNTER — Other Ambulatory Visit: Payer: Self-pay | Admitting: Family Medicine

## 2019-01-24 DIAGNOSIS — K219 Gastro-esophageal reflux disease without esophagitis: Secondary | ICD-10-CM

## 2019-02-07 ENCOUNTER — Telehealth: Payer: Self-pay

## 2019-02-08 ENCOUNTER — Other Ambulatory Visit
Admission: RE | Admit: 2019-02-08 | Discharge: 2019-02-08 | Disposition: A | Discharge status: Home / Self Care | Payer: Medicare Other | Attending: Cardiology | Admitting: Cardiology

## 2019-02-08 ENCOUNTER — Telehealth: Payer: Self-pay | Admitting: *Deleted

## 2019-02-08 DIAGNOSIS — I25810 Atherosclerosis of coronary artery bypass graft(s) without angina pectoris: Secondary | ICD-10-CM | POA: Diagnosis present

## 2019-02-08 DIAGNOSIS — I5020 Unspecified systolic (congestive) heart failure: Secondary | ICD-10-CM | POA: Diagnosis present

## 2019-02-08 DIAGNOSIS — I4891 Unspecified atrial fibrillation: Secondary | ICD-10-CM | POA: Diagnosis present

## 2019-02-08 LAB — BASIC METABOLIC PANEL
Anion gap: 10 (ref 5–15)
BUN: 40 mg/dL — ABNORMAL HIGH (ref 8–23)
CO2: 26 mmol/L (ref 22–32)
Calcium: 9.1 mg/dL (ref 8.9–10.3)
Chloride: 106 mmol/L (ref 98–111)
Creatinine, Ser: 1.5 mg/dL — ABNORMAL HIGH (ref 0.61–1.24)
GFR calc Af Amer: 51 mL/min — ABNORMAL LOW (ref >60)
GFR calc non Af Amer: 44 mL/min — ABNORMAL LOW (ref >60)
Glucose, Bld: 152 mg/dL — ABNORMAL HIGH (ref 70–99)
Potassium: 4.3 mmol/L (ref 3.5–5.1)
Sodium: 142 mmol/L (ref 135–145)

## 2019-02-08 NOTE — Telephone Encounter (Signed)
-----   Message from Kate Sable, MD sent at 02/08/2019  1:02 PM EST ----- Kidney dysfunction, no labs from prior to compare.  Please forward results to primary physician.  He may need nephrology input if primary physician deems necessary.

## 2019-02-08 NOTE — Telephone Encounter (Signed)
No answer. Left message to call back.   

## 2019-02-11 ENCOUNTER — Telehealth: Payer: Self-pay

## 2019-02-11 NOTE — Telephone Encounter (Signed)
Results called to pt. Pt verbalized understanding of results. I encouraged him to reach out to his PCP for further eval and management of kidney dysfunction. He verbalized understanding. Lab work also routed to [facility], NP.

## 2019-02-13 ENCOUNTER — Other Ambulatory Visit: Payer: Self-pay | Admitting: Nurse Practitioner

## 2019-02-13 DIAGNOSIS — E785 Hyperlipidemia, unspecified: Secondary | ICD-10-CM

## 2019-02-13 DIAGNOSIS — I2510 Atherosclerotic heart disease of native coronary artery without angina pectoris: Secondary | ICD-10-CM

## 2019-02-13 DIAGNOSIS — J4520 Mild intermittent asthma, uncomplicated: Secondary | ICD-10-CM

## 2019-02-13 DIAGNOSIS — E11621 Type 2 diabetes mellitus with foot ulcer: Secondary | ICD-10-CM

## 2019-02-14 ENCOUNTER — Ambulatory Visit (INDEPENDENT_AMBULATORY_CARE_PROVIDER_SITE_OTHER): Payer: Medicare Other | Admitting: Cardiology

## 2019-02-14 ENCOUNTER — Other Ambulatory Visit: Payer: Self-pay

## 2019-02-14 ENCOUNTER — Encounter: Payer: Self-pay | Admitting: Cardiology

## 2019-02-14 VITALS — BP 100/54 | HR 79 | Ht 73.0 in | Wt 207.5 lb

## 2019-02-14 DIAGNOSIS — I4891 Unspecified atrial fibrillation: Secondary | ICD-10-CM

## 2019-02-14 DIAGNOSIS — I5020 Unspecified systolic (congestive) heart failure: Secondary | ICD-10-CM

## 2019-02-14 DIAGNOSIS — Z79899 Other long term (current) drug therapy: Secondary | ICD-10-CM | POA: Diagnosis not present

## 2019-02-14 DIAGNOSIS — I25810 Atherosclerosis of coronary artery bypass graft(s) without angina pectoris: Secondary | ICD-10-CM

## 2019-02-14 MED ORDER — FUROSEMIDE 20 MG PO TABS: 20.0000 mg | ORAL_TABLET | Freq: Every day | ORAL | 3 refills | Status: DC

## 2019-02-14 NOTE — Patient Instructions (Addendum)
Medication Instructions:  - Your physician has recommended you make the following change in your medication:   1) Decrease lasix (furosemide) 20 mg- take 1 tablet by mouth once daily  *If you need a refill on your cardiac medications before your next appointment, please call your pharmacy*  Lab Work: - Your physician recommends that you return for lab work in: 4 weeks (just a couple of days prior to your follow up with Dr. [name])- [place] entrance at [facility]- 1st desk on the right to check in -Lab hours: Monday- Friday (7:30 am- 5:30 pm)  If you have labs (blood work) drawn today and your tests are completely normal, you will receive your results only by: [name] MyChart Message (if you have MyChart) OR . A paper copy in the mail If you have any lab test that is abnormal or we need to change your treatment, we will call you to review the results.  Testing/Procedures: - none ordered  Follow-Up: At [facility], you and your health needs are our priority.  As part of our continuing mission to provide you with exceptional heart care, we have created designated Provider Care Teams.  These Care Teams include your primary Cardiologist (physician) and Advanced Practice Providers (APPs -  Physician Assistants and Nurse Practitioners) who all work together to provide you with the care you need, when you need it.  Your next appointment:   1 month(s)  The format for your next appointment:   In Person  Provider:   Kate Sable, MD  Other Instructions - You have been referred to : CHF Clinic in [place] at the [place] at [facility] >> you will be called from their clinic to schedule

## 2019-02-14 NOTE — Progress Notes (Signed)
Cardiology Office Note:    Date:  02/14/2019   ID:  Joshua Roth, DOB [birth year], MRN [id number]  PCP:  Joshua College, NP (Inactive)  Cardiologist:  Joshua Sable, MD  Electrophysiologist:  None   Referring MD: No ref. provider found   Chief Complaint  Patient presents with  . office visit    4 week F/U; Meds verbally reviewed with patient.    History of Present Illness:    Joshua Roth is a 78 y.o. male with a hx of asthma, COPD, A. fib, CAD/CABG x3 in 2019, ICM EF 25-30%, who presents for follow-up.  He establish care earlier after moving from [facility].  After last visit, an echocardiogram showed severely reduced ejection fraction with EF 25 to 30%   3 years ago, he was diagnosed with atrial fibrillation and started on Eliquis.  He is on chronic steroid use for his asthma/COPD symptoms.  He has an albuterol rescue inhaler which he rarely uses due to lack of symptoms.    He otherwise feels fine, denies chest pain or dyspnea on exertion with ambulation.  He denies any history of smoking, but states being exposed to secondhand smoke.    After last visit, patient was started on Toprol-XL 25 mg daily and Entresto 24/26 mg twice daily.  Past Medical History:  Diagnosis Date  . A-fib ([place])   . Anemia   . Asthma   . COPD (chronic obstructive pulmonary disease) ([place])   . Coronary artery disease   . Diabetes mellitus without complication ([place])   . GERD (gastroesophageal reflux disease)   . Heart disease   . Hyperlipidemia     Past Surgical History:  Procedure Laterality Date  . CARDIAC CATHETERIZATION    . CORONARY ARTERY BYPASS GRAFT     cabg x 3 in 2019 at [facility]  . NECK SURGERY    . REPLACEMENT TOTAL HIP W/  RESURFACING IMPLANTS  2010   Left, Right   . WRIST FRACTURE SURGERY Left     Current Medications: Current Meds  Medication Sig  . albuterol (VENTOLIN HFA) 108 (90 Base) MCG/ACT inhaler Inhale 2 puffs into the lungs  every 6 (six) hours as needed for wheezing or shortness of breath.  [name] apixaban (ELIQUIS) 5 MG TABS tablet Take 1 tablet (5 mg total) by mouth 2 (two) times daily.  [name] atorvastatin (LIPITOR) 80 MG tablet Take 1 tablet (80 mg total) by mouth daily.  . diclofenac sodium (VOLTAREN) 1 % GEL Apply 2 g topically as needed.  . ferrous sulfate 325 (65 FE) MG EC tablet Take 325 mg by mouth daily.  . fluorouracil (EFUDEX) 5 % cream Apply topically 2 (two) times daily.  . Fluticasone-Salmeterol (WIXELA INHUB) 500-50 MCG/DOSE AEPB Inhale 1 puff into the lungs 2 (two) times daily.  . furosemide (LASIX) 40 MG tablet Take 1 tablet (40 mg total) by mouth daily.  [name] glipiZIDE (GLUCOTROL) 5 MG tablet Take 1 tablet (5 mg total) by mouth 2 (two) times daily before a meal.  . glucose blood (ONETOUCH VERIO) test strip Use as instructed  . Iron-Vitamins (GERITOL PO) Take by mouth.  . Lancets (ONETOUCH DELICA PLUS [id number]) MISC 1 Device by Does not apply route 2 (two) times a day.  . metoprolol succinate (TOPROL-XL) 25 MG 24 hr tablet Take 1 tablet (25 mg total) by mouth daily.  . montelukast (SINGULAIR) 10 MG tablet Take 1 tablet (10 mg total) by mouth at bedtime.  . nitroGLYCERIN (NITROSTAT) 0.4 MG  SL tablet Place 1 tablet (0.4 mg total) under the tongue every 5 (five) minutes as needed for chest pain (up to 3 doses).  . pantoprazole (PROTONIX) 40 MG tablet TAKE 1 TABLET BY MOUTH TWICE DAILY  . potassium chloride SA (K-DUR) 20 MEQ tablet Take 2 tablets (40 mEq total) by mouth daily.  . predniSONE (DELTASONE) 10 MG tablet Take 1 tablet (10 mg total) by mouth daily with breakfast.  . sacubitril-valsartan (ENTRESTO) 24-26 MG Take 1 tablet by mouth 2 (two) times daily.     Allergies:   Patient has no known allergies.   Social History   Socioeconomic History  . Marital status: Married    Spouse name: Not on file  . Number of children: Not on file  . Years of education: Not on file  . Highest education level: Not  on file  Occupational History  . Occupation: retired    Comment: From [employer] x 49 years  Tobacco Use  . Smoking status: Never Smoker  . Smokeless tobacco: Never Used  Substance and Sexual Activity  . Alcohol use: Yes    Alcohol/week: 2.0 standard drinks    Types: 2 Cans of beer per week  . Drug use: Never  . Sexual activity: Yes    Birth control/protection: None  Other Topics Concern  . Not on file  Social History Narrative  . Not on file   Social Determinants of Health   Financial Resource Strain:   . Difficulty of Paying Living Expenses: Not on file  Food Insecurity:   . Worried About Charity fundraiser in the Last Year: Not on file  . Ran Out of Food in the Last Year: Not on file  Transportation Needs:   . Lack of Transportation (Medical): Not on file  . Lack of Transportation (Non-Medical): Not on file  Physical Activity:   . Days of Exercise per Week: Not on file  . Minutes of Exercise per Session: Not on file  Stress:   . Feeling of Stress : Not on file  Social Connections:   . Frequency of Communication with Friends and Family: Not on file  . Frequency of Social Gatherings with Friends and Family: Not on file  . Attends Religious Services: Not on file  . Active Member of Clubs or Organizations: Not on file  . Attends Archivist Meetings: Not on file  . Marital Status: Not on file     Family History: The patient's He was adopted. Family history is unknown by patient.  ROS:   Please see the history of present illness.     All other systems reviewed and are negative.  EKGs/Labs/Other Studies Reviewed:    The following studies were reviewed today:  Echocardiogram dated 01/11/2019 1. Left ventricular ejection fraction, by visual estimation, is 25 to 30%. The left ventricle has severely decreased function. There is borderline left ventricular hypertrophy.  2. Left ventricular diastolic parameters are indeterminate.  3. Mildly dilated left ventricular  internal cavity size.  4. The left ventricle demonstrates global hypokinesis.  5. Global right ventricle has mildly reduced systolic function.The right ventricular size is normal. No increase in right ventricular wall thickness.  6. Left atrial size was mildly dilated.  7. Moderately elevated pulmonary artery systolic pressure.  8. The inferior vena cava is dilated in size with >50% respiratory variability, suggesting right atrial pressure of 8 mmHg.   EKG:  EKG ordered today shows sinus rhythm, right bundle branch block, premature atrial contractions.  Recent Labs: 02/08/2019: BUN 40; Creatinine, Ser 1.50; Potassium 4.3; Sodium 142  Recent Lipid Panel No results found for: CHOL, TRIG, HDL, CHOLHDL, VLDL, LDLCALC, LDLDIRECT  Physical Exam:    VS:  BP (!) 100/54 (BP Location: Left Arm, Patient Position: Sitting, Cuff Size: Normal)   Pulse 79   Ht 6\' 1"  (1.854 m)   Wt 207 lb 8 oz (94.1 kg)   SpO2 96%   BMI 27.38 kg/m     Wt Readings from Last 3 Encounters:  02/14/19 207 lb 8 oz (94.1 kg)  01/17/19 203 lb (92.1 kg)  12/04/18 205 lb 4 oz (93.1 kg)     GEN:  Well nourished, well developed in no acute distress HEENT: Normal NECK: No JVD; No carotid bruits LYMPHATICS: No lymphadenopathy CARDIAC: Irregular beats, non-tachycardic., no murmurs, rubs, gallops RESPIRATORY:  Clear to auscultation without rales, wheezing or rhonchi  ABDOMEN: Soft, non-tender, non-distended MUSCULOSKELETAL:  No edema; No deformity  SKIN: Warm and dry NEUROLOGIC:  Alert and oriented x 3 PSYCHIATRIC:  Normal affect   ASSESSMENT:   Ischemic cardiomyopathy, last echocardiogram showed reduced ejection fraction with EF 25 to 30%.  BMP shows creatinine of 1.5.  Patient appears euvolemic.  Blood pressure on the low normal side.  1. Heart failure with reduced ejection fraction ([place])   2. Coronary artery disease involving coronary bypass graft of native heart without angina pectoris   3. Atrial fibrillation,  unspecified type ([place])   4. Medication management    PLAN:    In order of problems listed above:  1. Continue Toprol-XL 25 mg daily,  Entresto 24/26 mg twice daily.  Decrease Lasix to 20 mg daily.  Check weights daily.  We will plan to titrate up Entresto and Toprol XL as BP permits. Check BMP prior to follow-up visit in 4 weeks.  We will schedule patient to our heart failure clinic in [place]. 2. Patient currently without chest pain , continue Lipitor daily.  He is on Eliquis.  It is reasonable to hold off aspirin for now to decrease bleeding risks.  Will request cath, CABG/surgical reports from [place]. 3. History of paroxysmal A. fib, chadsvasc atleast 5 (chf, dm, age,cad). Continue Eliquis 5 mg twice daily.    Follow-up in about 4 weeks.  Total encounter time more than 40 minutes  Greater than 50% was spent in counseling and coordination of care with the patient   Medication Adjustments/Labs and Tests Ordered: Current medicines are reviewed at length with the patient today.  Concerns regarding medicines are outlined above.  Orders Placed This Encounter  Procedures  . Basic metabolic panel  . EKG 12-Lead   No orders of the defined types were placed in this encounter.   Patient Instructions  Medication Instructions:  - Your physician has recommended you make the following change in your medication:   1) Decrease lasix (furosemide) 20 mg- take 1 tablet by mouth once daily  *If you need a refill on your cardiac medications before your next appointment, please call your pharmacy*  Lab Work: - Your physician recommends that you return for lab work in: 4 weeks (just a couple of days prior to your follow up with Dr. [name])- [place] entrance at [facility]- 1st desk on the right to check in Lab hours: Monday- Friday (7:30 am- 5:30 pm)  If you have labs (blood work) drawn today and your tests are completely normal, you will receive your results only by: [name]  MyChart Message (if you  have MyChart) OR . A paper copy in the mail If you have any lab test that is abnormal or we need to change your treatment, we will call you to review the results.  Testing/Procedures: - none ordered  Follow-Up: At [facility], you and your health needs are our priority.  As part of our continuing mission to provide you with exceptional heart care, we have created designated Provider Care Teams.  These Care Teams include your primary Cardiologist (physician) and Advanced Practice Providers (APPs -  Physician Assistants and Nurse Practitioners) who all work together to provide you with the care you need, when you need it.  Your next appointment:   1 month(s)  The format for your next appointment:   In Person  Provider:   Kate Sable, MD  Other Instructions - You have been referred to : CHF Clinic in [place] at the [place] at [facility] >> you will be called from their clinic to schedule      Signed, Joshua Sable, MD  02/14/2019 11:33 AM    [place]

## 2019-02-15 ENCOUNTER — Telehealth: Payer: Self-pay

## 2019-02-21 ENCOUNTER — Ambulatory Visit (INDEPENDENT_AMBULATORY_CARE_PROVIDER_SITE_OTHER): Payer: Medicare Other | Admitting: Pharmacist

## 2019-02-21 DIAGNOSIS — I2510 Atherosclerotic heart disease of native coronary artery without angina pectoris: Secondary | ICD-10-CM | POA: Diagnosis not present

## 2019-02-21 DIAGNOSIS — E11621 Type 2 diabetes mellitus with foot ulcer: Secondary | ICD-10-CM

## 2019-02-21 DIAGNOSIS — L97509 Non-pressure chronic ulcer of other part of unspecified foot with unspecified severity: Secondary | ICD-10-CM | POA: Diagnosis not present

## 2019-02-21 NOTE — Patient Instructions (Signed)
Thank you allowing the Chronic Care Management Team to be a part of your care! It was a pleasure speaking with you today!     CCM (Chronic Care Management) Team    [name] RN, BSN Nurse Care Coordinator  [phone]   [name] PharmD  Clinical Pharmacist  [phone]   [name] LCSW Clinical Social Worker [phone]  Visit Information  Goals Addressed            This Visit's Progress   . PharmD - Medication Management       Current Barriers:  . Financial Barriers - reports currently has Medicare Part A and B; no current Medicare Part D coverage o Reports enrolled with [facility] AARP plan for 2021 calendar year . Lack of available lab results as patient is new to the office/area  Pharmacist Clinical Goal(s):  [name] Over the next 30 days, patient will work with CM Pharmacist and PCP to address needs related to medication regimen optimization and coordination of care  Interventions: . Counsel on importance of medication adherence . Counsel on importance of checking daily weights as directed by Cardiology, following up with Cardiology for readings outside of provided parameters and bringing log to medical appointments . Counsel on importance of continuing to monitor blood pressure, close follow up with Cardiology and bringing log to medical appointments o Reports taking: - Metoprolol ER 25 mg once daily - Entresto 24-26 mg - 1 tablet twice daily - Furosemide 20 mg once daily (confirms reducing dose as directed by Cardiologist) o Reports the following readings - 12/15: 116/53, HR 74 - 12/16: 115/54, HR 76 - 12/17: 124/62, HR 47 (Patient denies dizziness with reading) o Encourage patient to follow up closely with Cardiology for further low or high readings or for symptoms . Counsel on importance of blood sugar control and monitoring o Confirms taking glipizide 5 mg twice daily o Reports recent morning fasting CBGs ranging: 85-105  o Denies any recent low  blood sugars - Confirms understanding of signs of low and how to treat a low blood sugar o Encourage patient to continue checking blood sugar regularly, keeping log and bringing record to appointments . Discuss alternative diabetes management therapy options, particularly given increased risk of hypoglycemia, age and renal function o Given patient's HFrEF and latest eGFR result (~44 mL/min), would recommend SGLT-2 inhibitors with demonstrated reduction in CV outcomes in HF patients and to reduce CKD progression in cardiovascular outcome trials (empagliflozin, canagliflozin and dapagliflozin)  o Patient states willing to consider change in diabetes therapy in new calendar year, as he will then be enrolled in Part D coverage . Review medication assistance options o Based on reported income, patient eligible to apply for assistance for Entresto from [employer] o Note based on reported income, patient not currently eligible for assistance for Eliquis or Advair inhalers . Will collaborate with [facility] CPhT to aid patient with applying for medication assistance for Entresto from [employer] for 2021 calendar year  Patient Self Care Activities:  . Self administers medications as prescribed . Attends all scheduled provider appointments o Next Cardiologist appointment scheduled for 03/19/19 . Calls pharmacy for medication refills . Calls provider office for new concerns or questions . Patient to check daily weights, blood pressure and blood sugar as directed and keep log   Please see past updates related to this goal by clicking on the "Past Updates" button in the selected goal         The patient verbalized understanding of  instructions provided today and declined a print copy of patient instruction materials.   Telephone follow up appointment with care management team member scheduled for: 1/11 at 1:30 pm  [name], PharmD, [place] [phone]

## 2019-02-21 NOTE — Chronic Care Management (AMB) (Signed)
Chronic Care Management   Follow Up Note   02/21/2019 Name: Nirav Sweda MRN: [id number] DOB: 06/26/1940  Referred by: [name], NP (Inactive) Reason for referral : Chronic Care Management (Patient Phone Call)   Lestat Golob is a 78 y.o. year old male who is a primary care patient of [name], NP (Inactive). The CCM team was consulted for assistance with chronic disease management and care coordination needs.  Mr. Dickenson has a past medical history including but not limited to type 2 diabetes mellitus, heart failure with reduced ejection fraction, s/p CABG x3 (04/2017), asthma and GERD.  I reached out to [employer] by phone today.   Review of patient status, including review of consultants reports, relevant laboratory and other test results, and collaboration with appropriate care team members and the patient's provider was performed as part of comprehensive patient evaluation and provision of chronic care management services.    Objective  Lab Results  Component Value Date   CREATININE 1.50 (H) 02/08/2019   BUN 40 (H) 02/08/2019   NA 142 02/08/2019   K 4.3 02/08/2019   CL 106 02/08/2019   CO2 26 02/08/2019   .  Outpatient Encounter Medications as of 02/21/2019  Medication Sig Note  . albuterol (VENTOLIN HFA) 108 (90 Base) MCG/ACT inhaler Inhale 2 puffs into the lungs every 6 (six) hours as needed for wheezing or shortness of breath.   [name] apixaban (ELIQUIS) 5 MG TABS tablet Take 1 tablet (5 mg total) by mouth 2 (two) times daily.   [name] atorvastatin (LIPITOR) 80 MG tablet TAKE 1 TABLET BY MOUTH EVERY DAY   . Fluticasone-Salmeterol (WIXELA INHUB) 500-50 MCG/DOSE AEPB Inhale 1 puff into the lungs 2 (two) times daily.   . furosemide (LASIX) 20 MG tablet Take 1 tablet (20 mg total) by mouth daily.   [name] glipiZIDE (GLUCOTROL) 5 MG tablet Take 1 tablet (5 mg total) by mouth 2 (two) times daily before a meal.   . metoprolol succinate (TOPROL-XL) 25 MG 24  hr tablet Take 1 tablet (25 mg total) by mouth daily.   . sacubitril-valsartan (ENTRESTO) 24-26 MG Take 1 tablet by mouth 2 (two) times daily.   . diclofenac sodium (VOLTAREN) 1 % GEL Apply 2 g topically as needed.   . ferrous sulfate 325 (65 FE) MG EC tablet Take 325 mg by mouth daily. 10/30/2018: Taking in morning prior to breakfast  . fluorouracil (EFUDEX) 5 % cream Apply topically 2 (two) times daily.   [name] glucose blood (ONETOUCH VERIO) test strip Use as instructed   . Iron-Vitamins (GERITOL PO) Take by mouth.   . Lancets (ONETOUCH DELICA PLUS [id number]) MISC 1 Device by Does not apply route 2 (two) times a day.   . montelukast (SINGULAIR) 10 MG tablet TAKE 1 TABLET BY MOUTH EVERY NIGHT AT BEDTIME   . nitroGLYCERIN (NITROSTAT) 0.4 MG SL tablet Place 1 tablet (0.4 mg total) under the tongue every 5 (five) minutes as needed for chest pain (up to 3 doses).   . pantoprazole (PROTONIX) 40 MG tablet TAKE 1 TABLET BY MOUTH TWICE DAILY   . potassium chloride SA (K-DUR) 20 MEQ tablet Take 2 tablets (40 mEq total) by mouth daily.   . predniSONE (DELTASONE) 10 MG tablet Take 1 tablet (10 mg total) by mouth daily with breakfast.    No facility-administered encounter medications on file as of 02/21/2019.    Goals Addressed            This Visit's Progress   .  PharmD - Medication Management       Current Barriers:  . Financial Barriers - reports currently has Medicare Part A and B; no current Medicare Part D coverage o Reports enrolled with [facility] AARP plan for 2021 calendar year . Lack of available lab results as patient is new to the office/area  Pharmacist Clinical Goal(s):  [name] Over the next 30 days, patient will work with CM Pharmacist and PCP to address needs related to medication regimen optimization and coordination of care  Interventions: . Perform chart review o ECHO completed 11/6 - estimated LVEF ~25-30% o BMP drawn on 12/4 by Cardiology - Creatinine 1.5 mg/dL o On 12/10, Cardiology  placed referral to [facility] in [place] . Counsel on importance of medication adherence . Counsel on importance of checking daily weights as directed by Cardiology, following up with Cardiology for readings outside of provided parameters and bringing log to medical appointments . Counsel on importance of continuing to monitor blood pressure, close follow up with Cardiology and bringing log to medical appointments o Reports taking: - Metoprolol ER 25 mg once daily - Entresto 24-26 mg - 1 tablet twice daily - Furosemide 20 mg once daily (confirms reducing dose as directed by Cardiologist) o Reports the following readings - 12/15: 116/53, HR 74 - 12/16: 115/54, HR 76 - 12/17: 124/62, HR 47 (Patient denies dizziness with reading) o Encourage patient to follow up closely with Cardiology for further low or high readings or for symptoms . Counsel on importance of blood sugar control and monitoring o Confirms taking glipizide 5 mg twice daily o Reports recent morning fasting CBGs ranging: 85-105  o Denies any recent low blood sugars - Confirms understanding of signs of low and how to treat a low blood sugar o Encourage patient to continue checking blood sugar regularly, keeping log and bringing record to appointments . Discuss alternative diabetes management therapy options, particularly given increased risk of hypoglycemia, age and renal function o Patient states willing to consider change in diabetes therapy in new calendar year, as he will then be enrolled in Part D coverage o Given patient's HFrEF and latest eGFR result (~44 mL/min), would recommend SGLT-2 inhibitors with demonstrated reduction in CV outcomes in HF patients and to reduce CKD progression in cardiovascular outcome trials (empagliflozin, canagliflozin and dapagliflozin)  . Review medication assistance options o Based on reported income, patient eligible to apply for assistance for Entresto from [employer] o Note based on  reported income, patient not currently eligible for assistance for Eliquis or Advair inhalers . Will collaborate with [facility] CPhT to aid patient with applying for medication assistance for Entresto from [employer] for 2021 calendar year  Patient Self Care Activities:  . Self administers medications as prescribed . Attends all scheduled provider appointments o Next Cardiologist appointment scheduled for 03/19/19 . Calls pharmacy for medication refills . Calls provider office for new concerns or questions . Patient to check daily weights, blood pressure and blood sugar as directed and keep log   Please see past updates related to this goal by clicking on the "Past Updates" button in the selected goal        Plan   Telephone follow up appointment with care management team member scheduled for: 1/11 at 1:30 pm  [name], PharmD, [place] [phone]

## 2019-03-06 ENCOUNTER — Other Ambulatory Visit: Payer: Self-pay | Admitting: Pharmacy Technician

## 2019-03-06 NOTE — Patient Outreach (Signed)
[place] [facility]) Care Management  03/06/2019  Yanick Sylte 05-31-1940 [id number]                                      Medication Assistance Referral  Referral From: [facility] Embedded RPh Dorthula Perfect   Medication/Company: Delene Loll / [employer] Patient application portion:  Mailed Provider application portion: Faxed  to Dr. Kate Sable Provider address/fax verified via: Office website    Follow up:  Will follow up with patient in 5-10 business days to confirm application(s) have been received.  Nasire Reali P. Lenzi Marmo, [place] Management [phone]

## 2019-03-07 ENCOUNTER — Telehealth: Payer: Self-pay

## 2019-03-07 NOTE — Telephone Encounter (Signed)
Patient assistance application for [employer] Joshua Roth) received from the [facility] Management [facility] and placed on Dr. Thereasa Solo desk to be signed.

## 2019-03-12 NOTE — Telephone Encounter (Signed)
Document signed by Dr [name] and faxed back to [place] at [facility].

## 2019-03-18 ENCOUNTER — Ambulatory Visit (INDEPENDENT_AMBULATORY_CARE_PROVIDER_SITE_OTHER): Payer: Medicare Other | Admitting: Pharmacist

## 2019-03-18 ENCOUNTER — Telehealth: Payer: Self-pay

## 2019-03-18 ENCOUNTER — Other Ambulatory Visit: Payer: Self-pay | Admitting: Pharmacy Technician

## 2019-03-18 ENCOUNTER — Other Ambulatory Visit
Admission: RE | Admit: 2019-03-18 | Discharge: 2019-03-18 | Disposition: A | Discharge status: Home / Self Care | Payer: Medicare Other | Source: Ambulatory Visit | Attending: Cardiology | Admitting: Cardiology

## 2019-03-18 DIAGNOSIS — E11621 Type 2 diabetes mellitus with foot ulcer: Secondary | ICD-10-CM

## 2019-03-18 DIAGNOSIS — L97509 Non-pressure chronic ulcer of other part of unspecified foot with unspecified severity: Secondary | ICD-10-CM

## 2019-03-18 DIAGNOSIS — I5020 Unspecified systolic (congestive) heart failure: Secondary | ICD-10-CM | POA: Diagnosis present

## 2019-03-18 DIAGNOSIS — Z79899 Other long term (current) drug therapy: Secondary | ICD-10-CM | POA: Insufficient documentation

## 2019-03-18 DIAGNOSIS — I2510 Atherosclerotic heart disease of native coronary artery without angina pectoris: Secondary | ICD-10-CM | POA: Diagnosis not present

## 2019-03-18 LAB — BASIC METABOLIC PANEL
Anion gap: 10 (ref 5–15)
BUN: 27 mg/dL — ABNORMAL HIGH (ref 8–23)
CO2: 24 mmol/L (ref 22–32)
Calcium: 9 mg/dL (ref 8.9–10.3)
Chloride: 108 mmol/L (ref 98–111)
Creatinine, Ser: 1.49 mg/dL — ABNORMAL HIGH (ref 0.61–1.24)
GFR calc Af Amer: 51 mL/min — ABNORMAL LOW (ref >60)
GFR calc non Af Amer: 44 mL/min — ABNORMAL LOW (ref >60)
Glucose, Bld: 144 mg/dL — ABNORMAL HIGH (ref 70–99)
Potassium: 4.4 mmol/L (ref 3.5–5.1)
Sodium: 142 mmol/L (ref 135–145)

## 2019-03-18 NOTE — Patient Outreach (Signed)
[facility]) Care Management  03/18/2019  Chevis Lounds 08-12-1940 [id number]    Successful call placed to patient regarding patient assistance application(s) for Entresto with [employer] , HIPAA identifiers verified.   Patient confirmed having the application. He feels as though he is over income for the program. Informed patient that the pharmacist usually pre screens for that information and that we could always try. Patient informed he would send the applications back into me but was unable to provide a timeline as to when he would mail the application back.  Follow up:  Will route note to embedded [facility] RPh [name] for case closure if document(s) have not been received in the next 15 business days.  Nazareth Norenberg P. Lonza Shimabukuro, [employer] [phone]

## 2019-03-18 NOTE — Patient Instructions (Signed)
Thank you allowing the Chronic Care Management Team to be a part of your care! It was a pleasure speaking with you today!     CCM (Chronic Care Management) Team    [name] RN, BSN Nurse Care Coordinator  [phone]   [name] PharmD  Clinical Pharmacist  [phone]   [name] LCSW Clinical Social Worker [phone]  Visit Information  Goals Addressed            This Visit's Progress   . PharmD - Medication Management       Current Barriers:  . 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 Lack of available lab results as patient is new to the office/area  Pharmacist Clinical Goal(s):  [name] Over the next 30 days, patient will work with CM Pharmacist and PCP to address needs related to medication regimen optimization and coordination of care  Interventions: . Counsel on new Medicare Part D Plan, addressing patient's questions about formulary coverage using 2021 formulary information from [employer] . Counsel on importance of continuing to monitor blood pressure, close follow up with Cardiology and bringing log to medical appointments o Patient unable to provide readings today as is not at home, but report BP and HR have been "good" o Encourage patient to follow up closely with Cardiology for further low or high readings or for symptoms . Counsel on importance of blood sugar control and monitoring o Reports recent morning fasting CBGs ranging: 95-110 . Counseling on diabetes management therapy options, particularly given increased risk of hypoglycemia, age and renal function o Given patient's HFrEF and latest eGFR result, would recommend SGLT-2 inhibitors with demonstrated reduction in CV outcomes in HF patients and to reduce CKD progression in cardiovascular outcome trials (empagliflozin,  canagliflozin and dapagliflozin)  o Patient states that he will discuss potential CV benefits with Cardiologist at appointment tomorrow. . Counsel patient on medication assistance options available to him . Patient denies having received call from [facility] since referral by Cardiologist o Note per chart, new patient appointment has been scheduled for 2/4 o Provide patient with phone number for Heart Failure Clinic again and advise him to call to follow up . Patient advises that he has decided to change PCP office as previous PCP [name]) is no longer at the practice. Will notify our clinic team.  Patient Self Care Activities:  . Self administers medications as prescribed . Attends all scheduled provider appointments o Next Cardiologist appointment scheduled for 03/19/19 . Calls pharmacy for medication refills . Calls provider office for new concerns or questions . Patient to check daily weights, blood pressure and blood sugar as directed and keep log   Please see past updates related to this goal by clicking on the "Past Updates" button in the selected goal         The patient verbalized understanding of instructions provided today and declined a print copy of patient instruction materials.   No further follow up required: Patient reports he will be seeing a new provider office for his PCP needs.  [name], PharmD, [place] [employer] [phone]

## 2019-03-18 NOTE — Chronic Care Management (AMB) (Signed)
Chronic Care Management   Follow Up Note   03/18/2019 Name: Joshua Roth MRN: [id number] DOB: [birth year]  Referred by: [name], NP (Inactive) Reason for referral : Chronic Care Management (Patient Phone Call)   Joshua Roth is a 79 y.o. year old male who is a primary care patient of [name], NP (Inactive). The CCM team was consulted for assistance with chronic disease management and care coordination needs.  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  I reached out to [employer] by phone today.   Review of patient status, including review of consultants reports, relevant laboratory and other test results, and collaboration with appropriate care team members and the patient's provider was performed as part of comprehensive patient evaluation and provision of chronic care management services.     Outpatient Encounter Medications as of 03/18/2019  Medication Sig Note  . albuterol (VENTOLIN HFA) 108 (90 Base) MCG/ACT inhaler Inhale 2 puffs into the lungs every 6 (six) hours as needed for wheezing or shortness of breath.   [name] apixaban (ELIQUIS) 5 MG TABS tablet Take 1 tablet (5 mg total) by mouth 2 (two) times daily.   [name] atorvastatin (LIPITOR) 80 MG tablet TAKE 1 TABLET BY MOUTH EVERY DAY   . diclofenac sodium (VOLTAREN) 1 % GEL Apply 2 g topically as needed.   . ferrous sulfate 325 (65 FE) MG EC tablet Take 325 mg by mouth daily. 10/30/2018: Taking in morning prior to breakfast  . fluorouracil (EFUDEX) 5 % cream Apply topically 2 (two) times daily.   . Fluticasone-Salmeterol (WIXELA INHUB) 500-50 MCG/DOSE AEPB Inhale 1 puff into the lungs 2 (two) times daily.   . furosemide (LASIX) 20 MG tablet Take 1 tablet (20 mg total) by mouth daily.   [name] glipiZIDE (GLUCOTROL) 5 MG tablet Take 1 tablet (5 mg total) by mouth 2 (two)  times daily before a meal.   . glucose blood (ONETOUCH VERIO) test strip Use as instructed   . Iron-Vitamins (GERITOL PO) Take by mouth.   . Lancets (ONETOUCH DELICA PLUS [id number]) MISC 1 Device by Does not apply route 2 (two) times a day.   . metoprolol succinate (TOPROL-XL) 25 MG 24 hr tablet Take 1 tablet (25 mg total) by mouth daily.   . montelukast (SINGULAIR) 10 MG tablet TAKE 1 TABLET BY MOUTH EVERY NIGHT AT BEDTIME   . nitroGLYCERIN (NITROSTAT) 0.4 MG SL tablet Place 1 tablet (0.4 mg total) under the tongue every 5 (five) minutes as needed for chest pain (up to 3 doses).   . pantoprazole (PROTONIX) 40 MG tablet TAKE 1 TABLET BY MOUTH TWICE DAILY   . potassium chloride SA (K-DUR) 20 MEQ tablet Take 2 tablets (40 mEq total) by mouth daily.   . predniSONE (DELTASONE) 10 MG tablet Take 1 tablet (10 mg total) by mouth daily with breakfast.   . sacubitril-valsartan (ENTRESTO) 24-26 MG Take 1 tablet by mouth 2 (two) times daily.    No facility-administered encounter medications on file as of 03/18/2019.    Goals Addressed            This Visit's Progress   . PharmD - Medication Management       Current Barriers:  . 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  Lack of available lab results as patient is new to the office/area  Pharmacist Clinical Goal(s):  [name] Over the next 30 days, patient will work with CM Pharmacist and PCP to address needs related to medication regimen optimization and coordination of care  Interventions: . Counsel on new Medicare Part D Plan, addressing patient's questions about formulary coverage using 2021 formulary information from [employer] . Counsel on importance of continuing to monitor blood pressure, close follow up with Cardiology and bringing log to medical appointments o Patient unable to  provide readings today as is not at home, but report BP and HR have been "good" o Encourage patient to follow up closely with Cardiology for further low or high readings or for symptoms . Counsel on importance of blood sugar control and monitoring o Reports recent morning fasting CBGs ranging: 95-110 . Counseling on diabetes management therapy options, particularly given increased risk of hypoglycemia, age and renal function o Given patient's HFrEF and latest eGFR result, would recommend SGLT-2 inhibitors with demonstrated reduction in CV outcomes in HF patients and to reduce CKD progression in cardiovascular outcome trials (empagliflozin, canagliflozin and dapagliflozin)  o Patient states that he will discuss potential CV benefits with Cardiologist at appointment tomorrow. . Counsel patient on medication assistance options available to him . Patient denies having received call from [facility] since referral by Cardiologist o Note per chart, new patient appointment has been scheduled for 2/4 o Provide patient with phone number for Heart Failure Clinic again and advise him to call to follow up . Patient advises that he has decided to change PCP office as previous PCP [name]) is no longer at the practice. Will notify our clinic team.  Patient Self Care Activities:  . Self administers medications as prescribed . Attends all scheduled provider appointments o Next Cardiologist appointment scheduled for 03/19/19 . Calls pharmacy for medication refills . Calls provider office for new concerns or questions . Patient to check daily weights, blood pressure and blood sugar as directed and keep log   Please see past updates related to this goal by clicking on the "Past Updates" button in the selected goal         Plan  No further follow up required: Patient reports he will be seeing a new provider office for his PCP needs.  [name], PharmD, [place] [employer] [phone]

## 2019-03-19 ENCOUNTER — Encounter: Payer: Self-pay | Admitting: Cardiology

## 2019-03-19 ENCOUNTER — Ambulatory Visit (INDEPENDENT_AMBULATORY_CARE_PROVIDER_SITE_OTHER): Payer: Medicare Other | Admitting: Cardiology

## 2019-03-19 ENCOUNTER — Other Ambulatory Visit: Payer: Self-pay

## 2019-03-19 VITALS — BP 110/50 | HR 83 | Ht 73.0 in | Wt 211.2 lb

## 2019-03-19 DIAGNOSIS — I5020 Unspecified systolic (congestive) heart failure: Secondary | ICD-10-CM

## 2019-03-19 DIAGNOSIS — I25810 Atherosclerosis of coronary artery bypass graft(s) without angina pectoris: Secondary | ICD-10-CM | POA: Diagnosis not present

## 2019-03-19 DIAGNOSIS — I4891 Unspecified atrial fibrillation: Secondary | ICD-10-CM | POA: Diagnosis not present

## 2019-03-19 NOTE — Progress Notes (Signed)
Cardiology Office Note:    Date:  03/19/2019   ID:  Joshua Roth, DOB [birth year], MRN [id number]  PCP:  [name], NP (Inactive)  Cardiologist:  Kate Sable, MD  Electrophysiologist:  None   Referring MD: No ref. provider found   Chief Complaint  Patient presents with  . other    1 month f/u no complaints today. Meds reviewed verbally with pt.    History of Present Illness:    Joshua Roth is a 79 y.o. male with a hx of asthma, COPD, A. fib, CAD/CABG x3 in 2019, ICM EF 25-30%, who presents for follow-up.  He establish care earlier after moving from [facility].  Last echocardiogram showed severely reduced ejection fraction with EF 25 to 30% in 2017, he was diagnosed with atrial fibrillation and started on Eliquis.  He is on chronic steroid use for his asthma/COPD symptoms.  He has an albuterol rescue inhaler which he rarely uses due to lack of symptoms.    He otherwise feels fine, denies chest pain or dyspnea on exertion with ambulation.  He denies any history of smoking, but states being exposed to secondhand smoke.    After last visit, patient was continued on Toprol-XL 25 mg daily and Entresto 24/26 mg twice daily.  Delene Loll was not increased due to low normal blood pressures.  Past Medical History:  Diagnosis Date  . A-fib ([place])   . Anemia   . Asthma   . COPD (chronic obstructive pulmonary disease) ([place])   . Coronary artery disease   . Diabetes mellitus without complication ([place])   . GERD (gastroesophageal reflux disease)   . Heart disease   . Hyperlipidemia     Past Surgical History:  Procedure Laterality Date  . CARDIAC CATHETERIZATION    . CORONARY ARTERY BYPASS GRAFT     cabg x 3 in 2019 at [facility]  . NECK SURGERY    . REPLACEMENT TOTAL HIP W/  RESURFACING IMPLANTS  2010   Left, Right   . WRIST FRACTURE SURGERY Left     Current Medications: Current Meds  Medication Sig  . albuterol (VENTOLIN HFA) 108 (90  Base) MCG/ACT inhaler Inhale 2 puffs into the lungs every 6 (six) hours as needed for wheezing or shortness of breath.  [name] apixaban (ELIQUIS) 5 MG TABS tablet Take 1 tablet (5 mg total) by mouth 2 (two) times daily.  [name] atorvastatin (LIPITOR) 80 MG tablet TAKE 1 TABLET BY MOUTH EVERY DAY  . diclofenac sodium (VOLTAREN) 1 % GEL Apply 2 g topically as needed.  . ferrous sulfate 325 (65 FE) MG EC tablet Take 325 mg by mouth daily.  . fluorouracil (EFUDEX) 5 % cream Apply topically 2 (two) times daily.  . Fluticasone-Salmeterol (WIXELA INHUB) 500-50 MCG/DOSE AEPB Inhale 1 puff into the lungs 2 (two) times daily.  . furosemide (LASIX) 20 MG tablet Take 1 tablet (20 mg total) by mouth daily.  [name] glipiZIDE (GLUCOTROL) 5 MG tablet Take 1 tablet (5 mg total) by mouth 2 (two) times daily before a meal.  . glucose blood (ONETOUCH VERIO) test strip Use as instructed  . Iron-Vitamins (GERITOL PO) Take by mouth.  . Lancets (ONETOUCH DELICA PLUS [id number]) MISC 1 Device by Does not apply route 2 (two) times a day.  . metoprolol succinate (TOPROL-XL) 25 MG 24 hr tablet Take 1 tablet (25 mg total) by mouth daily.  . montelukast (SINGULAIR) 10 MG tablet TAKE 1 TABLET BY MOUTH EVERY NIGHT AT BEDTIME  .  nitroGLYCERIN (NITROSTAT) 0.4 MG SL tablet Place 1 tablet (0.4 mg total) under the tongue every 5 (five) minutes as needed for chest pain (up to 3 doses).  . pantoprazole (PROTONIX) 40 MG tablet TAKE 1 TABLET BY MOUTH TWICE DAILY  . potassium chloride SA (K-DUR) 20 MEQ tablet Take 2 tablets (40 mEq total) by mouth daily.  . predniSONE (DELTASONE) 10 MG tablet Take 1 tablet (10 mg total) by mouth daily with breakfast.  . sacubitril-valsartan (ENTRESTO) 24-26 MG Take 1 tablet by mouth 2 (two) times daily.     Allergies:   Patient has no known allergies.   Social History   Socioeconomic History  . Marital status: Married    Spouse name: Not on file  . Number of children: Not on file  . Years of education: Not on  file  . Highest education level: Not on file  Occupational History  . Occupation: retired    Comment: From [employer] x 49 years  Tobacco Use  . Smoking status: Never Smoker  . Smokeless tobacco: Never Used  Substance and Sexual Activity  . Alcohol use: Yes    Alcohol/week: 2.0 standard drinks    Types: 2 Cans of beer per week  . Drug use: Never  . Sexual activity: Yes    Birth control/protection: None  Other Topics Concern  . Not on file  Social History Narrative  . Not on file   Social Determinants of Health   Financial Resource Strain:   . Difficulty of Paying Living Expenses: Not on file  Food Insecurity:   . Worried About Charity fundraiser in the Last Year: Not on file  . Ran Out of Food in the Last Year: Not on file  Transportation Needs:   . Lack of Transportation (Medical): Not on file  . Lack of Transportation (Non-Medical): Not on file  Physical Activity:   . Days of Exercise per Week: Not on file  . Minutes of Exercise per Session: Not on file  Stress:   . Feeling of Stress : Not on file  Social Connections:   . Frequency of Communication with Friends and Family: Not on file  . Frequency of Social Gatherings with Friends and Family: Not on file  . Attends Religious Services: Not on file  . Active Member of Clubs or Organizations: Not on file  . Attends Archivist Meetings: Not on file  . Marital Status: Not on file     Family History: The patient's He was adopted. Family history is unknown by patient.  ROS:   Please see the history of present illness.     All other systems reviewed and are negative.  EKGs/Labs/Other Studies Reviewed:    The following studies were reviewed today:  Echocardiogram dated 01/11/2019 1. Left ventricular ejection fraction, by visual estimation, is 25 to 30%. The left ventricle has severely decreased function. There is borderline left ventricular hypertrophy.  2. Left ventricular diastolic parameters are indeterminate.   3. Mildly dilated left ventricular internal cavity size.  4. The left ventricle demonstrates global hypokinesis.  5. Global right ventricle has mildly reduced systolic function.The right ventricular size is normal. No increase in right ventricular wall thickness.  6. Left atrial size was mildly dilated.  7. Moderately elevated pulmonary artery systolic pressure.  8. The inferior vena cava is dilated in size with >50% respiratory variability, suggesting right atrial pressure of 8 mmHg.   EKG:  EKG ordered today shows sinus rhythm, right bundle branch block,  premature atrial contractions.  Recent Labs: 03/18/2019: BUN 27; Creatinine, Ser 1.49; Potassium 4.4; Sodium 142  Recent Lipid Panel No results found for: CHOL, TRIG, HDL, CHOLHDL, VLDL, LDLCALC, LDLDIRECT  Physical Exam:    VS:  BP (!) 110/50 (BP Location: Left Arm, Patient Position: Sitting, Cuff Size: Normal)   Pulse 83   Ht 6\' 1"  (1.854 m)   Wt 211 lb 4 oz (95.8 kg)   SpO2 98%   BMI 27.87 kg/m     Wt Readings from Last 3 Encounters:  03/19/19 211 lb 4 oz (95.8 kg)  02/14/19 207 lb 8 oz (94.1 kg)  01/17/19 203 lb (92.1 kg)     GEN:  Well nourished, well developed in no acute distress HEENT: Normal NECK: No JVD; No carotid bruits LYMPHATICS: No lymphadenopathy CARDIAC: Irregular beats, non-tachycardic., no murmurs, rubs, gallops RESPIRATORY:  Clear to auscultation without rales, wheezing or rhonchi  ABDOMEN: Soft, non-tender, non-distended MUSCULOSKELETAL:  No edema; No deformity  SKIN: Warm and dry NEUROLOGIC:  Alert and oriented x 3 PSYCHIATRIC:  Normal affect   ASSESSMENT:   Ischemic cardiomyopathy, last echocardiogram showed reduced ejection fraction with EF 25 to 30%.  BMP shows creatinine of 1.49, which is stable from prior.  Patient appears euvolemic.  Blood pressure on the low normal side.  1. Heart failure with reduced ejection fraction ([place])   2. Coronary artery disease involving coronary bypass graft of  native heart without angina pectoris   3. Atrial fibrillation, unspecified type ([place])    PLAN:    In order of problems listed above:  1. Continue Toprol-XL 25 mg daily,  Entresto 24/26 mg twice daily.  cont Lasix to 20 mg daily.  Check weights daily.  We will plan to titrate up Entresto and Toprol XL as BP permits. Keep appointment with heart failure at [facility] for any additional input input 2. Patient currently without chest pain , continue Lipitor daily.  He is on Eliquis.  It is reasonable to hold off aspirin for now to decrease bleeding risks.  Will request cath, CABG/surgical reports from [place]. 3. History of paroxysmal A. fib, chadsvasc atleast 5 (chf, dm, age,cad). Continue Eliquis 5 mg twice daily.    Follow-up in about 3 months .  Total encounter time more than 35 minutes  Greater than 50% was spent in counseling and coordination of care with the patient  This note was generated in part or whole with voice recognition software. Voice recognition is usually quite accurate but there are transcription errors that can and very often do occur. I apologize for any typographical errors that were not detected and corrected.  Medication Adjustments/Labs and Tests Ordered: Current medicines are reviewed at length with the patient today.  Concerns regarding medicines are outlined above.  Orders Placed This Encounter  Procedures  . EKG 12-Lead   No orders of the defined types were placed in this encounter.   Patient Instructions  Medication Instructions:  No medication changes.  *If you need a refill on your cardiac medications before your next appointment, please call your pharmacy*  Lab Work: No labs ordered. If you have labs (blood work) drawn today and your tests are completely normal, you will receive your results only by: [name] MyChart Message (if you have MyChart) OR . A paper copy in the mail If you have any lab test that is abnormal or we need to change your  treatment, we will call you to review the results.  Testing/Procedures: No test ordered.  Follow-Up: At [facility], you and your health needs are our priority.  As part of our continuing mission to provide you with exceptional heart care, we have created designated Provider Care Teams.  These Care Teams include your primary Cardiologist (physician) and Advanced Practice Providers (APPs -  Physician Assistants and Nurse Practitioners) who all work together to provide you with the care you need, when you need it.  Your next appointment:   3 month(s)  The format for your next appointment:   In Person  Provider:    You may see Kate Sable, MD or one of the following Advanced Practice Providers on your designated Care Team:    Murray Hodgkins, NP  Christell Faith, PA-C  Marrianne Mood, PA-C   Other Instructions NA     Signed, Kate Sable, MD  03/19/2019 11:28 AM    [place]

## 2019-03-19 NOTE — Patient Instructions (Signed)
Medication Instructions:  No medication changes.  *If you need a refill on your cardiac medications before your next appointment, please call your pharmacy*  Lab Work: No labs ordered. If you have labs (blood work) drawn today and your tests are completely normal, you will receive your results only by: [name] MyChart Message (if you have MyChart) OR . A paper copy in the mail If you have any lab test that is abnormal or we need to change your treatment, we will call you to review the results.  Testing/Procedures: No test ordered.  Follow-Up: At [facility], you and your health needs are our priority.  As part of our continuing mission to provide you with exceptional heart care, we have created designated Provider Care Teams.  These Care Teams include your primary Cardiologist (physician) and Advanced Practice Providers (APPs -  Physician Assistants and Nurse Practitioners) who all work together to provide you with the care you need, when you need it.  Your next appointment:   3 month(s)  The format for your next appointment:   In Person  Provider:    You may see Kate Sable, MD or one of the following Advanced Practice Providers on your designated Care Team:    Murray Hodgkins, NP  Christell Faith, PA-C  Marrianne Mood, PA-C   Other Instructions NA

## 2019-03-24 ENCOUNTER — Other Ambulatory Visit: Payer: Self-pay

## 2019-03-24 ENCOUNTER — Encounter: Payer: Self-pay | Admitting: Emergency Medicine

## 2019-03-24 ENCOUNTER — Emergency Department: Payer: Medicare Other

## 2019-03-24 ENCOUNTER — Emergency Department
Admission: EM | Admit: 2019-03-24 | Discharge: 2019-03-25 | Disposition: A | Discharge status: Other Acute Inpatient Hospital | Payer: Medicare Other | Attending: Emergency Medicine | Admitting: Emergency Medicine

## 2019-03-24 DIAGNOSIS — Z20822 Contact with and (suspected) exposure to covid-19: Secondary | ICD-10-CM | POA: Insufficient documentation

## 2019-03-24 DIAGNOSIS — J449 Chronic obstructive pulmonary disease, unspecified: Secondary | ICD-10-CM | POA: Insufficient documentation

## 2019-03-24 DIAGNOSIS — Z7901 Long term (current) use of anticoagulants: Secondary | ICD-10-CM | POA: Insufficient documentation

## 2019-03-24 DIAGNOSIS — E119 Type 2 diabetes mellitus without complications: Secondary | ICD-10-CM | POA: Insufficient documentation

## 2019-03-24 DIAGNOSIS — I2510 Atherosclerotic heart disease of native coronary artery without angina pectoris: Secondary | ICD-10-CM | POA: Diagnosis not present

## 2019-03-24 DIAGNOSIS — J45909 Unspecified asthma, uncomplicated: Secondary | ICD-10-CM | POA: Diagnosis not present

## 2019-03-24 DIAGNOSIS — Z79899 Other long term (current) drug therapy: Secondary | ICD-10-CM | POA: Insufficient documentation

## 2019-03-24 DIAGNOSIS — Z951 Presence of aortocoronary bypass graft: Secondary | ICD-10-CM | POA: Diagnosis not present

## 2019-03-24 DIAGNOSIS — M25552 Pain in left hip: Secondary | ICD-10-CM | POA: Insufficient documentation

## 2019-03-24 LAB — CBC WITH DIFFERENTIAL/PLATELET
Abs Immature Granulocytes: 0.09 10*3/uL — ABNORMAL HIGH (ref 0.00–0.07)
Basophils Absolute: 0.1 10*3/uL (ref 0.0–0.1)
Basophils Relative: 1 %
Eosinophils Absolute: 0 10*3/uL (ref 0.0–0.5)
Eosinophils Relative: 1 %
HCT: 40.7 % (ref 39.0–52.0)
Hemoglobin: 13.1 g/dL (ref 13.0–17.0)
Immature Granulocytes: 1 %
Lymphocytes Relative: 10 %
Lymphs Abs: 0.8 10*3/uL (ref 0.7–4.0)
MCH: 30.3 pg (ref 26.0–34.0)
MCHC: 32.2 g/dL (ref 30.0–36.0)
MCV: 94.2 fL (ref 80.0–100.0)
Monocytes Absolute: 0.4 10*3/uL (ref 0.1–1.0)
Monocytes Relative: 4 %
Neutro Abs: 7.3 10*3/uL (ref 1.7–7.7)
Neutrophils Relative %: 83 %
Platelets: 130 10*3/uL — ABNORMAL LOW (ref 150–400)
RBC: 4.32 MIL/uL (ref 4.22–5.81)
RDW: 14.7 % (ref 11.5–15.5)
WBC: 8.6 10*3/uL (ref 4.0–10.5)
nRBC: 0 % (ref 0.0–0.2)

## 2019-03-24 LAB — COMPREHENSIVE METABOLIC PANEL
ALT: 13 U/L (ref 0–44)
AST: 18 U/L (ref 15–41)
Albumin: 3.7 g/dL (ref 3.5–5.0)
Alkaline Phosphatase: 162 U/L — ABNORMAL HIGH (ref 38–126)
Anion gap: 10 (ref 5–15)
BUN: 34 mg/dL — ABNORMAL HIGH (ref 8–23)
CO2: 25 mmol/L (ref 22–32)
Calcium: 9.6 mg/dL (ref 8.9–10.3)
Chloride: 106 mmol/L (ref 98–111)
Creatinine, Ser: 1.67 mg/dL — ABNORMAL HIGH (ref 0.61–1.24)
GFR calc Af Amer: 45 mL/min — ABNORMAL LOW (ref >60)
GFR calc non Af Amer: 39 mL/min — ABNORMAL LOW (ref >60)
Glucose, Bld: 157 mg/dL — ABNORMAL HIGH (ref 70–99)
Potassium: 4.8 mmol/L (ref 3.5–5.1)
Sodium: 141 mmol/L (ref 135–145)
Total Bilirubin: 1 mg/dL (ref 0.3–1.2)
Total Protein: 6.5 g/dL (ref 6.5–8.1)

## 2019-03-24 LAB — LACTIC ACID, PLASMA: Lactic Acid, Venous: 1.6 mmol/L (ref 0.5–1.9)

## 2019-03-24 MED ORDER — ACETAMINOPHEN 500 MG PO TABS
1000.0000 mg | ORAL_TABLET | Freq: Once | ORAL | Status: AC
  Administered 2019-03-24: 1000 mg via ORAL
  Filled 2019-03-24: qty 2

## 2019-03-24 MED ORDER — ACETAMINOPHEN 325 MG PO TABS
650.0000 mg | ORAL_TABLET | Freq: Once | ORAL | Status: AC
  Administered 2019-03-25: 650 mg via ORAL
  Filled 2019-03-24: qty 2

## 2019-03-24 NOTE — ED Triage Notes (Signed)
Pt arrived via POV with reports of left hip pain x 1 week, pt has hx of bilateral hip replacement.  Denies any injury to hip, pt ambulatory but reports some pain when bearing weight. Pt describes the pain as sharp.

## 2019-03-24 NOTE — ED Notes (Signed)
Pt states he believes dr that did surgery was located at [facility]. Believes doctors name was Dr. Dyanne Iha, MD

## 2019-03-24 NOTE — ED Provider Notes (Signed)
Emergency Department Provider Note  ____________________________________________  Time seen: Approximately 6:10 PM  I have reviewed the triage vital signs and the nursing notes.   HISTORY  Chief Complaint Hip Pain   Historian Patient     HPI Joshua Roth is a 79 y.o. male with a history of A. fib, adrenal insufficiency, diabetes and COPD, presents to the emergency department with progressively worsening left-sided hip pain.  Patient has had left-sided total hip arthroplasty 10 years ago and has had multiple revisions due to septic arthritis.  Patient denies new falls or mechanisms of trauma.  He denies fever or chills at home.  No weight loss or weight gain. No other alleviating measures have been attempted.   Past Medical History:  Diagnosis Date  . A-fib ([place])   . Anemia   . Asthma   . COPD (chronic obstructive pulmonary disease) ([place])   . Coronary artery disease   . Diabetes mellitus without complication ([place])   . GERD (gastroesophageal reflux disease)   . Heart disease   . Hyperlipidemia      Immunizations up to date:  Yes.     Past Medical History:  Diagnosis Date  . A-fib ([place])   . Anemia   . Asthma   . COPD (chronic obstructive pulmonary disease) ([place])   . Coronary artery disease   . Diabetes mellitus without complication ([place])   . GERD (gastroesophageal reflux disease)   . Heart disease   . Hyperlipidemia     Patient Active Problem List   Diagnosis Date Noted  . Atrial fibrillation ([place]) 10/30/2018  . 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 Type 2 diabetes mellitus with foot ulcer, without long-term current use of insulin ([place]) 10/05/2018  . Coronary artery disease involving native coronary artery of native heart without angina pectoris 10/05/2018  . Simple chronic bronchitis ([place]) 10/05/2018  . Mild intermittent asthma without complication [id number]  . Pedal edema 10/05/2018  . Onychomycosis 10/05/2018  .  Cellulitis of toe of right foot 10/05/2018  . Dyslipidemia 10/05/2018  . Gastroesophageal reflux disease 10/05/2018    Past Surgical History:  Procedure Laterality Date  . CARDIAC CATHETERIZATION    . CORONARY ARTERY BYPASS GRAFT     cabg x 3 in 2019 at [facility]  . NECK SURGERY    . REPLACEMENT TOTAL HIP W/  RESURFACING IMPLANTS  2010   Left, Right   . WRIST FRACTURE SURGERY Left     Prior to Admission medications   Medication Sig Start Date End Date Taking? Authorizing Provider  albuterol (VENTOLIN HFA) 108 (90 Base) MCG/ACT inhaler Inhale 2 puffs into the lungs every 6 (six) hours as needed for wheezing or shortness of breath. 10/02/18   [name], NP  apixaban (ELIQUIS) 5 MG TABS tablet Take 1 tablet (5 mg total) by mouth 2 (two) times daily. 01/09/19   Kate Sable, MD  atorvastatin (LIPITOR) 80 MG tablet TAKE 1 TABLET BY MOUTH EVERY DAY 02/14/19   Parks Ranger, Devonne Doughty, DO  diclofenac sodium (VOLTAREN) 1 % GEL Apply 2 g topically as needed.    [provider]  ferrous sulfate 325 (65 FE) MG EC tablet Take 325 mg by mouth daily.    [provider]  fluorouracil (EFUDEX) 5 % cream Apply topically 2 (two) times daily.    [provider]  Fluticasone-Salmeterol (WIXELA INHUB) 500-50 MCG/DOSE AEPB Inhale 1 puff into the lungs 2 (two) times daily. 01/08/19   Olin Hauser, DO  furosemide (LASIX) 20 MG tablet Take 1 tablet (20 mg total) by mouth daily. 02/14/19 05/15/19  Kate Sable, MD  glipiZIDE (GLUCOTROL) 5 MG tablet Take 1 tablet (5 mg total) by mouth 2 (two) times daily before a meal. 10/02/18   [name], NP  glucose blood [facility] VERIO) test strip Use as instructed 10/02/18   [name], NP  Iron-Vitamins (GERITOL PO) Take by mouth.    [provider]  Lancets (ONETOUCH DELICA PLUS Q000111Q) MISC 1 Device by Does not apply route 2 (two) times a day. 10/02/18   [name], NP  metoprolol succinate (TOPROL-XL) 25 MG 24 hr tablet Take 1 tablet (25 mg total) by mouth daily. 01/17/19   Kate Sable, MD  montelukast (SINGULAIR) 10 MG tablet TAKE 1 TABLET BY MOUTH EVERY NIGHT AT BEDTIME 02/14/19   Karamalegos, Devonne Doughty, DO  nitroGLYCERIN (NITROSTAT) 0.4 MG SL tablet Place 1 tablet (0.4 mg total) under the tongue every 5 (five) minutes as needed for chest pain (up to 3 doses). 10/31/18   [name], NP  pantoprazole (PROTONIX) 40 MG tablet TAKE 1 TABLET BY MOUTH TWICE DAILY 01/25/19   Karamalegos, Devonne Doughty, DO  potassium chloride SA (K-DUR) 20 MEQ tablet Take 2 tablets (40 mEq total) by mouth daily. 10/02/18   [name], NP  predniSONE (DELTASONE) 10 MG tablet Take 1 tablet (10 mg total) by mouth daily with breakfast. 10/02/18   [name], NP  sacubitril-valsartan (ENTRESTO) 24-26 MG Take 1 tablet by mouth 2 (two) times daily. 01/17/19   Kate Sable, MD    Allergies Patient has no known allergies.  Family History  Adopted: Yes  Family history unknown: Yes    Social History Social History   Tobacco Use  . Smoking status: Never Smoker  . Smokeless tobacco: Never Used  Substance Use Topics  . Alcohol use: Yes    Alcohol/week: 2.0 standard drinks    Types: 2 Cans of beer per week  . Drug use: Never     Review of Systems  Constitutional: No fever/chills Eyes:  No discharge ENT: No upper respiratory complaints. Respiratory: no cough. No SOB/ use of accessory muscles to breath Gastrointestinal:   No nausea, no vomiting.  No diarrhea.  No constipation. Musculoskeletal: Patient has left hip pain.  Skin: Negative for rash, abrasions, lacerations, ecchymosis.    ____________________________________________   PHYSICAL EXAM:  VITAL SIGNS: ED Triage Vitals  Enc Vitals Group     BP 03/24/19 1621 (!) 99/50     Pulse Rate 03/24/19 1621 93     Resp 03/24/19 1621 18     Temp 03/24/19 1621 98.9 F  (37.2 C)     Temp Source 03/24/19 1621 Oral     SpO2 03/24/19 1621 95 %     Weight 03/24/19 1622 211 lb 4 oz (95.8 kg)     Height 03/24/19 1622 6\' 1"  (1.854 m)     Head Circumference --      Peak Flow --      Pain Score 03/24/19 1621 6     Pain Loc --      Pain Edu? --      Excl. in [place]? --      Constitutional: Alert and oriented. Well appearing and in no acute distress. Eyes: Conjunctivae are normal. PERRL. EOMI. Head: Atraumatic. ENT: Neck: No stridor.  No cervical spine tenderness to palpation. Cardiovascular: Normal rate, regular rhythm. Normal S1 and S2.  Good  peripheral circulation. Respiratory: Normal respiratory effort without tachypnea or retractions. Lungs CTAB. Good air entry to the bases with no decreased or absent breath sounds Gastrointestinal: Bowel sounds x 4 quadrants. Soft and nontender to palpation. No guarding or rigidity. No distention. Musculoskeletal: Patient has pain with attempted internal and external rotation of the left hip.  5 out of 5 strength in the upper and lower extremities bilaterally and symmetrically. Neurologic:  Normal for age. No gross focal neurologic deficits are appreciated.  Skin:  Skin is warm, dry and intact. No rash noted. Psychiatric: Mood and affect are normal for age. Speech and behavior are normal.   ____________________________________________   LABS (all labs ordered are listed, but only abnormal results are displayed)  Labs Reviewed  CBC WITH DIFFERENTIAL/PLATELET - Abnormal; Notable for the following components:      Result Value   Platelets 130 (*)    Abs Immature Granulocytes 0.09 (*)    All other components within normal limits  COMPREHENSIVE METABOLIC PANEL - Abnormal; Notable for the following components:   Glucose, Bld 157 (*)    BUN 34 (*)    Creatinine, Ser 1.67 (*)    Alkaline Phosphatase 162 (*)    GFR calc non Af Amer 39 (*)    GFR calc Af Amer 45 (*)    All other components within normal limits  SARS  CORONAVIRUS 2 (TAT 6-24 HRS)  LACTIC ACID, PLASMA  LACTIC ACID, PLASMA   ____________________________________________  EKG   ____________________________________________  RADIOLOGY Unk Pinto, personally viewed and evaluated these images (plain radiographs) as part of my medical decision making, as well as reviewing the written report by the radiologist.  DG Hip Unilat W or Wo Pelvis 2-3 Views Left  Result Date: 03/24/2019 CLINICAL DATA:  Left hip pain for 1 week EXAM: DG HIP (WITH OR WITHOUT PELVIS) 2-3V LEFT COMPARISON:  None. FINDINGS: Patient is status post bilateral total hip arthroplasties. There is extensive bone formation about the medial aspect of the proximal left femoral diaphysis with bone formation beyond the previously placed cerclage wire. There is a somewhat permeative appearance of the cortex of the proximal femoral diaphysis. Transversely oriented cortical lucency involving the lateral cortex at the level of the mid to distal left femoral stem suspicious for a nondisplaced fracture. Pelvic bony ring appears intact. IMPRESSION: 1. Nondisplaced periprosthetic fracture at the lateral cortex of the mid to distal left femoral stem. 2. Abnormal appearance of the proximal left femur with extensive bone formation emanating from the medial cortex of the proximal left femoral metadiaphysis. While these findings may reflect heterotopic bone formation, the appearance is suspicious for periosteal bone formation in the setting of a bone-forming malignancy. The lateral cortex of the proximal left femoral diaphysis has a permeative appearance. Although this could be secondary to focal demineralization/osteopenia, changes related to chronic infection or aggressive bone tumor are both considerations. Comparison with any prior outside studies would be extremely valuable to assess for interval changes. Orthopedic oncologic surgery consultation is recommended. These results were called by  telephone at the time of interpretation on 03/24/2019 at 5:53 pm to provider [facility] , who verbally acknowledged these results. Electronically Signed   By: Davina Poke D.O.   On: 03/24/2019 17:58    ____________________________________________    PROCEDURES  Procedure(s) performed:     Procedures     Medications  acetaminophen (TYLENOL) tablet 1,000 mg (1,000 mg Oral Given 03/24/19 1816)     ____________________________________________   INITIAL IMPRESSION /  ASSESSMENT AND PLAN / ED COURSE  Pertinent labs & imaging results that were available during my care of the patient were reviewed by me and considered in my medical decision making (see chart for details).      Assessment and Plan: Left Hip Pain:  79 year old male with a history of bilateral total hip arthroplasties presents to the emergency department with progressively worsening left hip pain for the past week.  Patient reports that left total hip arthroplasty was conducted in 2010 in [facility].  He has had extensive revisions due to history of septic arthritis on the left hip.  Patient was hypotensive at triage but vital signs were otherwise reassuring.  On physical exam, patient had tenderness with attempted internal and external rotation of the left hip.  Differential diagnosis included septic arthritis, prosthetic disruption, periprosthetic fracture...  X-ray examination is concerning for a nondisplaced periprosthetic fracture involving the mid distal left femur and overall abnormal appearance of the proximal left femur.  There is extensive heterotopic bone formation along the proximal left femur suspicious for malignancy.  Lateral aspect of proximal femur has a permeative appearance which could be secondary to osteopenia, chronic infection or aggressive bone tumor, according to Radiology.   ----------------------------------------- 7:08 PM on  03/24/2019 -----------------------------------------  Spoke with orthopedist on-call, Dr. Leim Fabry who recommended transfer to [facility].  ----------------------------------------- 9:34 PM on 03/24/2019 -----------------------------------------  Dr. Ruby Cola with medicine at [facility] accepted patient for admission.   Sayer Derbyshire was evaluated in Emergency Department on 03/24/2019 for the symptoms described in the history of present illness. He was evaluated in the context of the global COVID-19 pandemic, which necessitated consideration that the patient might be at risk for infection with the SARS-CoV-2 virus that causes COVID-19. Institutional protocols and algorithms that pertain to the evaluation of patients at risk for COVID-19 are in a state of rapid change based on information released by regulatory bodies including the CDC and federal and state organizations. These policies and algorithms were followed during the patient's care in the ED.     ____________________________________________  FINAL CLINICAL IMPRESSION(S) / ED DIAGNOSES  Final diagnoses:  Left hip pain      NEW MEDICATIONS STARTED DURING THIS VISIT:  ED Discharge Orders    None          This chart was dictated using voice recognition software/Dragon. Despite best efforts to proofread, errors can occur which can change the meaning. Any change was purely unintentional.     Lannie Fields, PA-C 03/24/19 2235    Arta Silence, MD 03/24/19 [phone]

## 2019-03-25 DIAGNOSIS — E2740 Unspecified adrenocortical insufficiency: Secondary | ICD-10-CM | POA: Insufficient documentation

## 2019-03-25 DIAGNOSIS — N183 Chronic kidney disease, stage 3 unspecified: Secondary | ICD-10-CM | POA: Insufficient documentation

## 2019-03-25 DIAGNOSIS — I5020 Unspecified systolic (congestive) heart failure: Secondary | ICD-10-CM | POA: Insufficient documentation

## 2019-03-25 DIAGNOSIS — Z8679 Personal history of other diseases of the circulatory system: Secondary | ICD-10-CM | POA: Insufficient documentation

## 2019-03-25 DIAGNOSIS — E119 Type 2 diabetes mellitus without complications: Secondary | ICD-10-CM | POA: Insufficient documentation

## 2019-03-25 DIAGNOSIS — Z96649 Presence of unspecified artificial hip joint: Secondary | ICD-10-CM | POA: Insufficient documentation

## 2019-03-25 LAB — GLUCOSE, CAPILLARY: Glucose-Capillary: 208 mg/dL — ABNORMAL HIGH (ref 70–99)

## 2019-03-25 LAB — SARS CORONAVIRUS 2 (TAT 6-24 HRS): SARS Coronavirus 2: NEGATIVE

## 2019-03-25 NOTE — ED Notes (Signed)
[facility] called by this RN to let them know pt is en route. Patient sent with all belonging.

## 2019-03-25 NOTE — ED Notes (Signed)
[facility] at bedside to transfer patient. EMTALA completed by this RN. EDP reassessment complete

## 2019-03-29 DIAGNOSIS — C799 Secondary malignant neoplasm of unspecified site: Secondary | ICD-10-CM | POA: Insufficient documentation

## 2019-03-30 MED ORDER — GLUCAGON (RDNA) 1 MG IJ KIT
1.00 | PACK | INTRAMUSCULAR | Status: DC
Start: ? — End: 2019-03-30

## 2019-03-30 MED ORDER — GENERIC EXTERNAL MEDICATION
Status: DC
Start: 2019-03-30 — End: 2019-03-30

## 2019-03-30 MED ORDER — INSULIN LISPRO 100 UNIT/ML ~~LOC~~ SOLN
0.00 | SUBCUTANEOUS | Status: DC
Start: 2019-03-30 — End: 2019-03-30

## 2019-03-30 MED ORDER — LIDOCAINE HCL 1 % IJ SOLN
0.50 | INTRAMUSCULAR | Status: DC
Start: ? — End: 2019-03-30

## 2019-03-30 MED ORDER — APIXABAN 5 MG PO TABS
5.00 | ORAL_TABLET | ORAL | Status: DC
Start: 2019-03-30 — End: 2019-03-30

## 2019-03-30 MED ORDER — BICALUTAMIDE 50 MG PO TABS
50.00 | ORAL_TABLET | ORAL | Status: DC
Start: 2019-03-31 — End: 2019-03-30

## 2019-03-30 MED ORDER — CALCIUM CARBONATE-VITAMIN D 600-400 MG-UNIT PO TABS
1.00 | ORAL_TABLET | ORAL | Status: DC
Start: 2019-03-31 — End: 2019-03-30

## 2019-03-30 MED ORDER — DEXTROSE 50 % IV SOLN
12.50 | INTRAVENOUS | Status: DC
Start: ? — End: 2019-03-30

## 2019-03-30 MED ORDER — PANTOPRAZOLE SODIUM 40 MG PO TBEC
40.00 | DELAYED_RELEASE_TABLET | ORAL | Status: DC
Start: 2019-03-30 — End: 2019-03-30

## 2019-03-30 MED ORDER — ATORVASTATIN CALCIUM 40 MG PO TABS
80.00 | ORAL_TABLET | ORAL | Status: DC
Start: 2019-03-31 — End: 2019-03-30

## 2019-03-30 MED ORDER — SENNOSIDES-DOCUSATE SODIUM 8.6-50 MG PO TABS
2.00 | ORAL_TABLET | ORAL | Status: DC
Start: 2019-03-30 — End: 2019-03-30

## 2019-03-30 MED ORDER — METOPROLOL SUCCINATE ER 25 MG PO TB24
25.00 | ORAL_TABLET | ORAL | Status: DC
Start: 2019-03-31 — End: 2019-03-30

## 2019-03-30 MED ORDER — ALBUTEROL SULFATE HFA 108 (90 BASE) MCG/ACT IN AERS
2.00 | INHALATION_SPRAY | RESPIRATORY_TRACT | Status: DC
Start: ? — End: 2019-03-30

## 2019-03-30 MED ORDER — FUROSEMIDE 20 MG PO TABS
20.00 | ORAL_TABLET | ORAL | Status: DC
Start: 2019-03-31 — End: 2019-03-30

## 2019-03-30 MED ORDER — NITROGLYCERIN 0.4 MG SL SUBL
0.40 | SUBLINGUAL_TABLET | SUBLINGUAL | Status: DC
Start: ? — End: 2019-03-30

## 2019-03-30 MED ORDER — BUDESONIDE-FORMOTEROL FUMARATE 160-4.5 MCG/ACT IN AERO
2.00 | INHALATION_SPRAY | RESPIRATORY_TRACT | Status: DC
Start: 2019-03-30 — End: 2019-03-30

## 2019-03-30 MED ORDER — DIPHENHYDRAMINE HCL 25 MG PO CAPS
25.00 | ORAL_CAPSULE | ORAL | Status: DC
Start: ? — End: 2019-03-30

## 2019-03-30 MED ORDER — ACETAMINOPHEN 325 MG PO TABS
650.00 | ORAL_TABLET | ORAL | Status: DC
Start: ? — End: 2019-03-30

## 2019-03-30 MED ORDER — OXYCODONE HCL 5 MG PO TABS
5.00 | ORAL_TABLET | ORAL | Status: DC
Start: ? — End: 2019-03-30

## 2019-03-30 MED ORDER — PREDNISONE 10 MG PO TABS
10.00 | ORAL_TABLET | ORAL | Status: DC
Start: 2019-03-31 — End: 2019-03-30

## 2019-03-30 MED ORDER — SACUBITRIL-VALSARTAN 24-26 MG PO TABS
1.00 | ORAL_TABLET | ORAL | Status: DC
Start: 2019-03-30 — End: 2019-03-30

## 2019-03-30 MED ORDER — MONTELUKAST SODIUM 10 MG PO TABS
10.00 | ORAL_TABLET | ORAL | Status: DC
Start: 2019-03-30 — End: 2019-03-30

## 2019-04-08 ENCOUNTER — Inpatient Hospital Stay: Payer: Self-pay | Admitting: Family Medicine

## 2019-04-11 ENCOUNTER — Encounter (HOSPITAL_COMMUNITY): Payer: Medicare Other | Admitting: Internal Medicine

## 2019-04-12 DIAGNOSIS — C7951 Secondary malignant neoplasm of bone: Secondary | ICD-10-CM | POA: Insufficient documentation

## 2019-04-12 DIAGNOSIS — N3289 Other specified disorders of bladder: Secondary | ICD-10-CM | POA: Insufficient documentation

## 2019-04-23 ENCOUNTER — Other Ambulatory Visit: Payer: Self-pay | Admitting: Family Medicine

## 2019-04-23 DIAGNOSIS — K219 Gastro-esophageal reflux disease without esophagitis: Secondary | ICD-10-CM

## 2019-04-25 ENCOUNTER — Telehealth: Payer: Self-pay

## 2019-05-15 ENCOUNTER — Other Ambulatory Visit: Payer: Self-pay

## 2019-05-15 MED ORDER — APIXABAN 5 MG PO TABS: 5.0000 mg | ORAL_TABLET | Freq: Two times a day (BID) | ORAL | 1 refills | Status: DC

## 2019-05-15 MED ORDER — SACUBITRIL-VALSARTAN 24-26 MG PO TABS: 1.0000 | ORAL_TABLET | Freq: Two times a day (BID) | ORAL | 3 refills | Status: DC

## 2019-05-15 MED ORDER — METOPROLOL SUCCINATE ER 25 MG PO TB24: 25.0000 mg | ORAL_TABLET | Freq: Every day | ORAL | 3 refills | Status: DC

## 2019-05-15 NOTE — Telephone Encounter (Signed)
Incoming fax from [facility] requesting refill on Eliquis.

## 2019-05-15 NOTE — Telephone Encounter (Signed)
Pt's age 79, wt 95.8 kg, SCr 1.49, CrCl 55.37, last ov w/ BA 03/19/19. Eliquis refilled as requested.

## 2019-05-20 DIAGNOSIS — J4541 Moderate persistent asthma with (acute) exacerbation: Secondary | ICD-10-CM | POA: Insufficient documentation

## 2019-05-22 ENCOUNTER — Ambulatory Visit (HOSPITAL_COMMUNITY)
Admission: RE | Admit: 2019-05-22 | Discharge: 2019-05-22 | Disposition: A | Discharge status: Home / Self Care | Payer: Medicare Other | Source: Ambulatory Visit | Attending: Internal Medicine | Admitting: Internal Medicine

## 2019-05-22 ENCOUNTER — Encounter (HOSPITAL_COMMUNITY): Payer: Self-pay | Admitting: Internal Medicine

## 2019-05-22 ENCOUNTER — Other Ambulatory Visit: Payer: Self-pay

## 2019-05-22 VITALS — BP 106/52 | HR 77 | Wt 207.2 lb

## 2019-05-22 DIAGNOSIS — I2510 Atherosclerotic heart disease of native coronary artery without angina pectoris: Secondary | ICD-10-CM

## 2019-05-22 DIAGNOSIS — J449 Chronic obstructive pulmonary disease, unspecified: Secondary | ICD-10-CM | POA: Diagnosis not present

## 2019-05-22 DIAGNOSIS — Z951 Presence of aortocoronary bypass graft: Secondary | ICD-10-CM | POA: Diagnosis not present

## 2019-05-22 DIAGNOSIS — I5022 Chronic systolic (congestive) heart failure: Secondary | ICD-10-CM | POA: Diagnosis not present

## 2019-05-22 DIAGNOSIS — Z8546 Personal history of malignant neoplasm of prostate: Secondary | ICD-10-CM | POA: Diagnosis not present

## 2019-05-22 DIAGNOSIS — I480 Paroxysmal atrial fibrillation: Secondary | ICD-10-CM | POA: Diagnosis not present

## 2019-05-22 DIAGNOSIS — K219 Gastro-esophageal reflux disease without esophagitis: Secondary | ICD-10-CM | POA: Diagnosis not present

## 2019-05-22 DIAGNOSIS — E119 Type 2 diabetes mellitus without complications: Secondary | ICD-10-CM | POA: Diagnosis not present

## 2019-05-22 DIAGNOSIS — E785 Hyperlipidemia, unspecified: Secondary | ICD-10-CM | POA: Insufficient documentation

## 2019-05-22 DIAGNOSIS — Z7901 Long term (current) use of anticoagulants: Secondary | ICD-10-CM | POA: Diagnosis not present

## 2019-05-22 DIAGNOSIS — Z7984 Long term (current) use of oral hypoglycemic drugs: Secondary | ICD-10-CM | POA: Insufficient documentation

## 2019-05-22 DIAGNOSIS — Z79899 Other long term (current) drug therapy: Secondary | ICD-10-CM | POA: Insufficient documentation

## 2019-05-22 NOTE — Progress Notes (Signed)
ADVANCED HF CLINIC CONSULT NOTE  Referring Physician: Agbor-Etang Primary Cardiologist: Agbor-Etang PCP: Dr. Ola Spurr  HPI:  Joshua Roth is a 79 y.o. male with a hx of asthma, COPD (non-smoker), A. fib, CAD/CABG x3 in 2019 [place]), ICM EF 25-30% and DM2 who is referred by Dr. [name] for evaluation of HF.   Echo in 2017 showed EF 25 to 30% in 2017, he was diagnosed with atrial fibrillation and started on Eliquis.  He is on chronic steroid use for his asthma/COPD symptoms.  He has an albuterol rescue inhaler which he rarely uses due to lack of symptoms.    Says he is doing ok now. About 5-6 weeks ago was found to have a pathologic fracture of left hip due to metastatic prostate CA. Has been referred to Urology. No CP. Occasional ankle edema. Can do ADLs without too much SOB. Occasionally dizzy when he stands. Rare wheezing.    Review of Systems: [y] = yes, [ ]  = no   General: Weight gain [ ] ; Weight loss [ ] ; Anorexia [ ] ; Fatigue Blue.Reese ]; Fever [ ] ; Chills [ ] ; Weakness [ ]   Cardiac: Chest pain/pressure [ ] ; Resting SOB [ ] ; Exertional SOB Blue.Reese ]; Orthopnea [ ] ; Pedal Edema [ ] ; Palpitations [ ] ; Syncope [ ] ; Presyncope [ ] ; Paroxysmal nocturnal dyspnea[ ]   Pulmonary: Cough [ ] ; Wheezing[ y]; Hemoptysis[ ] ; Sputum [ ] ; Snoring [ ]   GI: Vomiting[ ] ; Dysphagia[ ] ; Melena[ ] ; Hematochezia [ ] ; Heartburn[ ] ; Abdominal pain [ ] ; Constipation [ ] ; Diarrhea [ ] ; BRBPR [ ]   GU: Hematuria[ ] ; Dysuria [ ] ; Nocturia[ ]   Vascular: Pain in legs with walking [ ] ; Pain in feet with lying flat [ ] ; Non-healing sores [ ] ; Stroke [ ] ; TIA [ ] ; Slurred speech [ ] ;  Neuro: Headaches[ ] ; Vertigo[ ] ; Seizures[ ] ; Paresthesias[ ] ;Blurred vision [ ] ; Diplopia [ ] ; Vision changes [ ]   Ortho/Skin: Arthritis Blue.Reese ]; Joint pain [ y]; Muscle pain [ ] ; Joint swelling [ ] ; Back Pain [ ] ; Rash [ ]   Psych: Depression[ ] ; Anxiety[ ]   Heme: Bleeding problems [ ] ; Clotting disorders [ ] ; Anemia [ ]   Endocrine:  Diabetes [ y]; Thyroid dysfunction[ ]    Past Medical History:  Diagnosis Date  . A-fib ([place])   . Anemia   . Asthma   . COPD (chronic obstructive pulmonary disease) ([place])   . Coronary artery disease   . Diabetes mellitus without complication ([place])   . GERD (gastroesophageal reflux disease)   . Heart disease   . Hyperlipidemia     Current Outpatient Medications  Medication Sig Dispense Refill  . acetaminophen (TYLENOL) 500 MG tablet Take by mouth.    [name] albuterol (VENTOLIN HFA) 108 (90 Base) MCG/ACT inhaler Inhale 2 puffs into the lungs every 6 (six) hours as needed for wheezing or shortness of breath. 8 g 11  . apixaban (ELIQUIS) 5 MG TABS tablet Take 1 tablet (5 mg total) by mouth 2 (two) times daily. 180 tablet 1  . atorvastatin (LIPITOR) 80 MG tablet TAKE 1 TABLET BY MOUTH EVERY DAY 90 tablet 1  . bicalutamide (CASODEX) 50 MG tablet Take by mouth.    . Calcium Carbonate-Vitamin D 600-400 MG-UNIT tablet Take by mouth.    . canagliflozin (INVOKANA) 100 MG TABS tablet Take by mouth.    . diclofenac sodium (VOLTAREN) 1 % GEL Apply 2 g topically as needed.    . fluorouracil (EFUDEX) 5 % cream Apply topically 2 (  two) times daily.    . Fluticasone-Salmeterol (WIXELA INHUB) 500-50 MCG/DOSE AEPB Inhale 1 puff into the lungs 2 (two) times daily. 60 each 5  . furosemide (LASIX) 20 MG tablet Take 1 tablet (20 mg total) by mouth daily. 90 tablet 3  . glipiZIDE (GLUCOTROL) 5 MG tablet Take 1 tablet (5 mg total) by mouth 2 (two) times daily before a meal. 90 tablet 1  . glucose blood (ONETOUCH VERIO) test strip Use as instructed 100 each 12  . Iron-Vitamins (GERITOL PO) Take by mouth.    . Lancets (ONETOUCH DELICA PLUS Q000111Q) MISC 1 Device by Does not apply route 2 (two) times a day. 200 each 1  . metoprolol succinate (TOPROL-XL) 25 MG 24 hr tablet Take 1 tablet (25 mg total) by mouth daily. 90 tablet 3  . montelukast (SINGULAIR) 10 MG tablet TAKE 1 TABLET BY MOUTH EVERY NIGHT AT BEDTIME 90  tablet 1  . Multiple Vitamins-Iron (MULTI-VITAMIN/IRON PO) Take by mouth.    . nitroGLYCERIN (NITROSTAT) 0.4 MG SL tablet Place 1 tablet (0.4 mg total) under the tongue every 5 (five) minutes as needed for chest pain (up to 3 doses). 25 tablet 1  . pantoprazole (PROTONIX) 40 MG tablet TAKE 1 TABLET BY MOUTH TWICE DAILY 180 tablet 0  . potassium chloride SA (K-DUR) 20 MEQ tablet Take 2 tablets (40 mEq total) by mouth daily. 180 tablet 1  . predniSONE (DELTASONE) 10 MG tablet Take 1 tablet (10 mg total) by mouth daily with breakfast. 180 tablet 1  . sacubitril-valsartan (ENTRESTO) 24-26 MG Take 1 tablet by mouth 2 (two) times daily. 180 tablet 3   No current facility-administered medications for this encounter.    No Known Allergies    Social History   Socioeconomic History  . Marital status: Married    Spouse name: Not on file  . Number of children: Not on file  . Years of education: Not on file  . Highest education level: Not on file  Occupational History  . Occupation: retired    Comment: From [employer] x 49 years  Tobacco Use  . Smoking status: Never Smoker  . Smokeless tobacco: Never Used  Substance and Sexual Activity  . Alcohol use: Yes    Alcohol/week: 2.0 standard drinks    Types: 2 Cans of beer per week  . Drug use: Never  . Sexual activity: Yes    Birth control/protection: None  Other Topics Concern  . Not on file  Social History Narrative  . Not on file   Social Determinants of Health   Financial Resource Strain:   . Difficulty of Paying Living Expenses:   Food Insecurity:   . Worried About Charity fundraiser in the Last Year:   . Arboriculturist in the Last Year:   Transportation Needs:   . Film/video editor (Medical):   [name] Lack of Transportation (Non-Medical):   Physical Activity: Inactive  . Days of Exercise per Week: 0 days  . Minutes of Exercise per Session: 0 min  Stress:   . Feeling of Stress :   Social Connections:   . Frequency of Communication  with Friends and Family:   . Frequency of Social Gatherings with Friends and Family:   . Attends Religious Services:   . Active Member of Clubs or Organizations:   . Attends Archivist Meetings:   [name] Marital Status:   Intimate Partner Violence:   . Fear of Current or Ex-Partner:   .  Emotionally Abused:   [name] Physically Abused:   . Sexually Abused:       Family History  Adopted: Yes  Family history unknown: Yes    Vitals:   05/22/19 1339  BP: (!) 106/52  Pulse: 77  SpO2: 97%  Weight: 94 kg (207 lb 3.2 oz)    PHYSICAL EXAM: General:  Elderly male No respiratory difficulty HEENT: normal Neck: supple. no JVD. Carotids 2+ bilat; no bruits. No lymphadenopathy or thryomegaly appreciated. Cor: PMI nondisplaced. Regular rate & rhythm. No rubs, gallops or murmurs. Lungs: clear Abdomen: soft, nontender, nondistended. No hepatosplenomegaly. No bruits or masses. Good bowel sounds. Extremities: no cyanosis, clubbing, rash, edema Neuro: alert & oriented x 3, cranial nerves grossly intact. moves all 4 extremities w/o difficulty. Affect pleasant.  ECG: NSR 75 RBBB Personally reviewed    ASSESSMENT & PLAN: 1. 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 May even be a bit dry. Can consider switch to PRN with recent addition of SGLT2i - Entresto 24/26 bid - Toprol 25 daily  - Continue SGLT2i  - BP too low to titrate further at this time   2. CAD - s/p CABG 2019 - stable no anginal sx - followed by Dr. Larence Penning  - continue statin. Off ASA with apixaban  3. PAF - In NSR - on apixaban. No obvious bleeding  4. DM - Hgb A1c ~ 8 Invokana recently added  5. Asthma/COPD, steroid dependent  - non-smoker - would likely benefit from a referral to Pulmonary   6. Metastatic prostate CA - recently diagnosed  Overall, he has been very  well managed from HF perspective. I have little to add at this point. Happy to see back as needed.    Glori Bickers, MD  2:07 PM

## 2019-05-22 NOTE — Patient Instructions (Signed)
NO changes in your medications today  Your physician recommends that you schedule a follow-up appointment as needed with Dr Haroldine Laws.   Please call office at [phone] option 2 if you have any questions or concerns.   At the [facility], you and your health needs are our priority. As part of our continuing mission to provide you with exceptional heart care, we have created designated Provider Care Teams. These Care Teams include your primary Cardiologist (physician) and Advanced Practice Providers (APPs- Physician Assistants and Nurse Practitioners) who all work together to provide you with the care you need, when you need it.   You may see any of the following providers on your designated Care Team at your next follow up: [name] Dr Glori Bickers . Dr Loralie Champagne . Darrick Grinder, NP . Lyda Jester, PA . Audry Riles, PharmD   Please be sure to bring in all your medications bottles to every appointment.

## 2019-06-17 ENCOUNTER — Encounter: Payer: Self-pay | Admitting: Cardiology

## 2019-06-17 ENCOUNTER — Ambulatory Visit (INDEPENDENT_AMBULATORY_CARE_PROVIDER_SITE_OTHER): Payer: Medicare Other | Admitting: Cardiology

## 2019-06-17 ENCOUNTER — Other Ambulatory Visit: Payer: Self-pay

## 2019-06-17 VITALS — BP 106/50 | HR 76 | Ht 73.5 in | Wt 208.1 lb

## 2019-06-17 DIAGNOSIS — I5020 Unspecified systolic (congestive) heart failure: Secondary | ICD-10-CM | POA: Diagnosis not present

## 2019-06-17 DIAGNOSIS — I25810 Atherosclerosis of coronary artery bypass graft(s) without angina pectoris: Secondary | ICD-10-CM

## 2019-06-17 DIAGNOSIS — I480 Paroxysmal atrial fibrillation: Secondary | ICD-10-CM

## 2019-06-17 NOTE — Progress Notes (Signed)
Cardiology Office Note:    Date:  06/17/2019   ID:  Joshua Roth, DOB 12-10-1940, MRN [id number]  PCP:  Leonel Ramsay, MD  Cardiologist:  Kate Sable, MD  Electrophysiologist:  None   Referring MD: No ref. provider found   Chief Complaint  Patient presents with  . Other    3 month follow up. Meds reviewed verbally with patient.     History of Present Illness:    Joshua Roth is a 79 y.o. male with a hx of asthma, prostate cancer with mets to bone, COPD, A. fib, CAD/CABG x3 in 2019, ICM EF 25-30%, who presents for follow-up.  He is being seen due to ischemic cardiomyopathy and medication titration.  Denies chest pain or shortness of breath.  Toprol-XL and Entresto are being titrated as needed depending on blood pressures.  States feeling okay.  His weight has stayed relatively stable.  Historical notes echocardiogram on 01/2019 showed severely reduced ejection fraction with EF 25 to 30%. In 2017, he was diagnosed with atrial fibrillation and started on Eliquis.  He is on chronic steroid use for his asthma/COPD symptoms.  He has an albuterol rescue inhaler which he rarely uses due to lack of symptoms.      Past Medical History:  Diagnosis Date  . A-fib ([place])   . Anemia   . Asthma   . COPD (chronic obstructive pulmonary disease) ([place])   . Coronary artery disease   . Diabetes mellitus without complication ([place])   . GERD (gastroesophageal reflux disease)   . Heart disease   . Hyperlipidemia     Past Surgical History:  Procedure Laterality Date  . CARDIAC CATHETERIZATION    . CORONARY ARTERY BYPASS GRAFT     cabg x 3 in 2019 [facility]  . NECK SURGERY    . REPLACEMENT TOTAL HIP W/  RESURFACING IMPLANTS  2010   Left, Right   . WRIST FRACTURE SURGERY Left     Current Medications: Current Meds  Medication Sig  . acetaminophen (TYLENOL) 500 MG tablet Take by mouth.  [name] albuterol (VENTOLIN HFA) 108 (90 Base) MCG/ACT inhaler Inhale 2 puffs into  the lungs every 6 (six) hours as needed for wheezing or shortness of breath.  [name] apixaban (ELIQUIS) 5 MG TABS tablet Take 1 tablet (5 mg total) by mouth 2 (two) times daily.  [name] atorvastatin (LIPITOR) 80 MG tablet TAKE 1 TABLET BY MOUTH EVERY DAY  . bicalutamide (CASODEX) 50 MG tablet Take by mouth.  . Calcium Carbonate-Vitamin D 600-400 MG-UNIT tablet Take by mouth.  . canagliflozin (INVOKANA) 100 MG TABS tablet Take by mouth.  . diclofenac sodium (VOLTAREN) 1 % GEL Apply 2 g topically as needed.  . fluorouracil (EFUDEX) 5 % cream Apply topically 2 (two) times daily.  . Fluticasone-Salmeterol (WIXELA INHUB) 500-50 MCG/DOSE AEPB Inhale 1 puff into the lungs 2 (two) times daily.  . furosemide (LASIX) 20 MG tablet Take 1 tablet (20 mg total) by mouth daily.  [name] glipiZIDE (GLUCOTROL) 5 MG tablet Take 1 tablet (5 mg total) by mouth 2 (two) times daily before a meal.  . glucose blood (ONETOUCH VERIO) test strip Use as instructed  . Iron-Vitamins (GERITOL PO) Take by mouth.  . Lancets (ONETOUCH DELICA PLUS [id number]) MISC 1 Device by Does not apply route 2 (two) times a day.  . metoprolol succinate (TOPROL-XL) 25 MG 24 hr tablet Take 1 tablet (25 mg total) by mouth daily.  . montelukast (SINGULAIR) 10 MG tablet TAKE 1  TABLET BY MOUTH EVERY NIGHT AT BEDTIME  . Multiple Vitamins-Iron (MULTI-VITAMIN/IRON PO) Take by mouth.  . nitroGLYCERIN (NITROSTAT) 0.4 MG SL tablet Place 1 tablet (0.4 mg total) under the tongue every 5 (five) minutes as needed for chest pain (up to 3 doses).  . pantoprazole (PROTONIX) 40 MG tablet TAKE 1 TABLET BY MOUTH TWICE DAILY  . potassium chloride SA (K-DUR) 20 MEQ tablet Take 2 tablets (40 mEq total) by mouth daily.  . predniSONE (DELTASONE) 10 MG tablet Take 1 tablet (10 mg total) by mouth daily with breakfast.  . sacubitril-valsartan (ENTRESTO) 24-26 MG Take 1 tablet by mouth 2 (two) times daily.     Allergies:   Patient has no known allergies.   Social History    Socioeconomic History  . Marital status: Married    Spouse name: Not on file  . Number of children: Not on file  . Years of education: Not on file  . Highest education level: Not on file  Occupational History  . Occupation: retired    Comment: From [employer] x 49 years  Tobacco Use  . Smoking status: Never Smoker  . Smokeless tobacco: Never Used  Substance and Sexual Activity  . Alcohol use: Yes    Alcohol/week: 2.0 standard drinks    Types: 2 Cans of beer per week  . Drug use: Never  . Sexual activity: Yes    Birth control/protection: None  Other Topics Concern  . Not on file  Social History Narrative  . Not on file   Social Determinants of Health   Financial Resource Strain:   . Difficulty of Paying Living Expenses:   Food Insecurity:   . Worried About Charity fundraiser in the Last Year:   . Arboriculturist in the Last Year:   Transportation Needs:   . Film/video editor (Medical):   [name] Lack of Transportation (Non-Medical):   Physical Activity: Inactive  . Days of Exercise per Week: 0 days  . Minutes of Exercise per Session: 0 min  Stress:   . Feeling of Stress :   Social Connections:   . Frequency of Communication with Friends and Family:   . Frequency of Social Gatherings with Friends and Family:   . Attends Religious Services:   . Active Member of Clubs or Organizations:   . Attends Archivist Meetings:   [name] Marital Status:      Family History: The patient's He was adopted. Family history is unknown by patient.  ROS:   Please see the history of present illness.     All other systems reviewed and are negative.  EKGs/Labs/Other Studies Reviewed:    The following studies were reviewed today:  Echocardiogram dated 01/11/2019 1. Left ventricular ejection fraction, by visual estimation, is 25 to 30%. The left ventricle has severely decreased function. There is borderline left ventricular hypertrophy.  2. Left ventricular diastolic parameters are  indeterminate.  3. Mildly dilated left ventricular internal cavity size.  4. The left ventricle demonstrates global hypokinesis.  5. Global right ventricle has mildly reduced systolic function.The right ventricular size is normal. No increase in right ventricular wall thickness.  6. Left atrial size was mildly dilated.  7. Moderately elevated pulmonary artery systolic pressure.  8. The inferior vena cava is dilated in size with >50% respiratory variability, suggesting right atrial pressure of 8 mmHg.   EKG:  EKG ordered today shows sinus rhythm, right bundle branch block, first-degree AV block,  Recent Labs: 03/24/2019: ALT  13; BUN 34; Creatinine, Ser 1.67; Hemoglobin 13.1; Platelets 130; Potassium 4.8; Sodium 141  Recent Lipid Panel No results found for: CHOL, TRIG, HDL, CHOLHDL, VLDL, LDLCALC, LDLDIRECT  Physical Exam:    VS:  BP (!) 106/50 (BP Location: Right Arm, Patient Position: Sitting, Cuff Size: Normal)   Pulse 76   Ht 6' 1.5" (1.867 m)   Wt 208 lb 2 oz (94.4 kg)   SpO2 98%   BMI 27.09 kg/m     Wt Readings from Last 3 Encounters:  06/17/19 208 lb 2 oz (94.4 kg)  05/22/19 207 lb 3.2 oz (94 kg)  03/24/19 211 lb 4 oz (95.8 kg)     GEN:  Well nourished, well developed in no acute distress HEENT: Normal NECK: No JVD; No carotid bruits LYMPHATICS: No lymphadenopathy CARDIAC: Irregular beats, non-tachycardic., no murmurs, rubs, gallops RESPIRATORY:  Clear to auscultation without rales, wheezing or rhonchi  ABDOMEN: Soft, non-tender, non-distended MUSCULOSKELETAL:  No edema; No deformity  SKIN: Warm and dry NEUROLOGIC:  Alert and oriented x 3 PSYCHIATRIC:  Normal affect   ASSESSMENT:    1. Heart failure with reduced ejection fraction ([place])   2. Coronary artery disease involving coronary bypass graft of native heart without angina pectoris   3. PAF (paroxysmal atrial fibrillation) (H[facility])    PLAN:    In order of problems listed above:  1. History of ischemic  cardiomyopathy, last EF 25 to 30%.  Patient is euvolemic.  BP remains low normal.  Continue Toprol-XL 25 mg daily,  Entresto 24/26 mg twice daily.  cont Lasix to 20 mg daily.  Repeat echocardiogram to evaluate EF.  We will plan to titrate up Entresto and Toprol XL as BP permits.  Blood pressure is low normal today. 2. History of CAD/CABG x3 in 2019.  Patient currently without chest pain , continue Lipitor daily.  He is on Eliquis.  3. History of paroxysmal A. fib, chadsvasc atleast 5 (chf, dm, age,cad).  Currently in sinus.  Toprol, continue Eliquis 5 mg twice daily.    Follow-up in about 3 months after repeat echocardiogram.   This note was generated in part or whole with voice recognition software. Voice recognition is usually quite accurate but there are transcription errors that can and very often do occur. I apologize for any typographical errors that were not detected and corrected.  Medication Adjustments/Labs and Tests Ordered: Current medicines are reviewed at length with the patient today.  Concerns regarding medicines are outlined above.  Orders Placed This Encounter  Procedures  . EKG 12-Lead  . ECHOCARDIOGRAM COMPLETE   No orders of the defined types were placed in this encounter.   Patient Instructions  Medication Instructions:  Your physician recommends that you continue on your current medications as directed. Please refer to the Current Medication list given to you today.  *If you need a refill on your cardiac medications before your next appointment, please call your pharmacy*   Lab Work: none If you have labs (blood work) drawn today and your tests are completely normal, you will receive your results only by: [name] MyChart Message (if you have MyChart) OR . A paper copy in the mail If you have any lab test that is abnormal or we need to change your treatment, we will call you to review the results.   Testing/Procedures: 1- ECHOCARDIOGRAM IN 3 MONTHS - Your physician  has requested that you have an echocardiogram. Echocardiography is a painless test that uses sound waves to create images of  your heart. It provides your doctor with information about the size and shape of your heart and how well your heart's chambers and valves are working. This procedure takes approximately one hour. There are no restrictions for this procedure. You may get an IV, if needed, to receive an ultrasound enhancing agent through to better visualize your heart.    Follow-Up: At [facility], you and your health needs are our priority.  As part of our continuing mission to provide you with exceptional heart care, we have created designated Provider Care Teams.  These Care Teams include your primary Cardiologist (physician) and Advanced Practice Providers (APPs -  Physician Assistants and Nurse Practitioners) who all work together to provide you with the care you need, when you need it.  We recommend signing up for the patient portal called "MyChart".  Sign up information is provided on this After Visit Summary.  MyChart is used to connect with patients for Virtual Visits (Telemedicine).  Patients are able to view lab/test results, encounter notes, upcoming appointments, etc.  Non-urgent messages can be sent to your provider as well.   To learn more about what you can do with MyChart, go to [email or web address].    Your next appointment:   3 month(s)  The format for your next appointment:   In Person  Provider:   Kate Sable, MD     Signed, Kate Sable, MD  06/17/2019 12:17 PM    [place]

## 2019-06-17 NOTE — Patient Instructions (Signed)
Medication Instructions:  Your physician recommends that you continue on your current medications as directed. Please refer to the Current Medication list given to you today.  *If you need a refill on your cardiac medications before your next appointment, please call your pharmacy*   Lab Work: none If you have labs (blood work) drawn today and your tests are completely normal, you will receive your results only by: [name] MyChart Message (if you have MyChart) OR . A paper copy in the mail If you have any lab test that is abnormal or we need to change your treatment, we will call you to review the results.   Testing/Procedures: 1- ECHOCARDIOGRAM IN 3 MONTHS - Your physician has requested that you have an echocardiogram. Echocardiography is a painless test that uses sound waves to create images of your heart. It provides your doctor with information about the size and shape of your heart and how well your heart's chambers and valves are working. This procedure takes approximately one hour. There are no restrictions for this procedure. You may get an IV, if needed, to receive an ultrasound enhancing agent through to better visualize your heart.    Follow-Up: At [facility], you and your health needs are our priority.  As part of our continuing mission to provide you with exceptional heart care, we have created designated Provider Care Teams.  These Care Teams include your primary Cardiologist (physician) and Advanced Practice Providers (APPs -  Physician Assistants and Nurse Practitioners) who all work together to provide you with the care you need, when you need it.  We recommend signing up for the patient portal called "MyChart".  Sign up information is provided on this After Visit Summary.  MyChart is used to connect with patients for Virtual Visits (Telemedicine).  Patients are able to view lab/test results, encounter notes, upcoming appointments, etc.  Non-urgent messages can be sent to your  provider as well.   To learn more about what you can do with MyChart, go to [email or web address].    Your next appointment:   3 month(s)  The format for your next appointment:   In Person  Provider:   Kate Sable, MD

## 2019-07-01 ENCOUNTER — Other Ambulatory Visit: Payer: Self-pay | Admitting: Infectious Diseases

## 2019-07-01 DIAGNOSIS — I739 Peripheral vascular disease, unspecified: Secondary | ICD-10-CM

## 2019-07-11 ENCOUNTER — Ambulatory Visit: Admission: RE | Admit: 2019-07-11 | Payer: Medicare Other | Source: Ambulatory Visit

## 2019-08-23 ENCOUNTER — Other Ambulatory Visit: Payer: Self-pay

## 2019-08-23 ENCOUNTER — Encounter (INDEPENDENT_AMBULATORY_CARE_PROVIDER_SITE_OTHER): Payer: Self-pay | Admitting: Vascular Surgery

## 2019-08-23 ENCOUNTER — Ambulatory Visit (INDEPENDENT_AMBULATORY_CARE_PROVIDER_SITE_OTHER): Payer: Medicare Other | Admitting: Vascular Surgery

## 2019-08-23 VITALS — BP 90/54 | HR 76 | Ht 73.0 in | Wt 215.0 lb

## 2019-08-23 DIAGNOSIS — E11621 Type 2 diabetes mellitus with foot ulcer: Secondary | ICD-10-CM | POA: Diagnosis not present

## 2019-08-23 DIAGNOSIS — E785 Hyperlipidemia, unspecified: Secondary | ICD-10-CM

## 2019-08-23 DIAGNOSIS — I7025 Atherosclerosis of native arteries of other extremities with ulceration: Secondary | ICD-10-CM

## 2019-08-23 DIAGNOSIS — I10 Essential (primary) hypertension: Secondary | ICD-10-CM | POA: Diagnosis not present

## 2019-08-23 DIAGNOSIS — I4891 Unspecified atrial fibrillation: Secondary | ICD-10-CM | POA: Diagnosis not present

## 2019-08-23 DIAGNOSIS — L97509 Non-pressure chronic ulcer of other part of unspecified foot with unspecified severity: Secondary | ICD-10-CM

## 2019-08-23 NOTE — Assessment & Plan Note (Signed)
lipid control important in reducing the progression of atherosclerotic disease. Continue statin therapy  

## 2019-08-23 NOTE — Progress Notes (Signed)
Patient ID: Joshua Roth, male   DOB: [birth year], 79 y.o.   MRN: [id number]  Chief Complaint  Patient presents with  . New Patient (Initial Visit)    Claudication abnormal ABI      HPI Joshua Roth is a 79 y.o. male.  I am asked to see the patient by Dr. Ola Spurr for evaluation of PAD and claudication symptoms.  Patient reports cramping and pain in his legs bilaterally with activity but has steadily progressed over the past couple of years.  This is worse in the right leg than the left leg.  There is no clear inciting event or causative factor that started the symptoms.  He has had a recurring ulceration on his right foot and toe area over the past year or so.  No ulcers on the left leg.  No fevers or chills or signs of infection.  This prompted his primary care physician to perform an ABI which I have independently reviewed.  The right ABI is significantly reduced at 0.75 while the left ABI was in the normal range at 1.2.  The study was done at rest.   Past Medical History:  Diagnosis Date  . A-fib ([place])   . Anemia   . Asthma   . COPD (chronic obstructive pulmonary disease) ([place])   . Coronary artery disease   . Diabetes mellitus without complication ([place])   . GERD (gastroesophageal reflux disease)   . Heart disease   . Hyperlipidemia     Past Surgical History:  Procedure Laterality Date  . CARDIAC CATHETERIZATION    . CORONARY ARTERY BYPASS GRAFT     cabg x 3 in 2019 at [facility]  . NECK SURGERY    . REPLACEMENT TOTAL HIP W/  RESURFACING IMPLANTS  2010   Left, Right   . WRIST FRACTURE SURGERY Left      Family History  Adopted: Yes  Family history unknown: Yes     Social History   Tobacco Use  . Smoking status: Never Smoker  . Smokeless tobacco: Never Used  Vaping Use  . Vaping Use: Never used  Substance Use Topics  . Alcohol use: Yes    Alcohol/week: 2.0 standard drinks    Types: 2 Cans of beer per week  . Drug use: Never  Retired from [employer]  last year and moved to this area with his wife.  No Known Allergies  Current Outpatient Medications  Medication Sig Dispense Refill  . acetaminophen (TYLENOL) 500 MG tablet Take by mouth.    [name] albuterol (VENTOLIN HFA) 108 (90 Base) MCG/ACT inhaler Inhale 2 puffs into the lungs every 6 (six) hours as needed for wheezing or shortness of breath. 8 g 11  . apixaban (ELIQUIS) 5 MG TABS tablet Take 1 tablet (5 mg total) by mouth 2 (two) times daily. 180 tablet 1  . atorvastatin (LIPITOR) 80 MG tablet TAKE 1 TABLET BY MOUTH EVERY DAY 90 tablet 1  . bicalutamide (CASODEX) 50 MG tablet Take by mouth.    . Calcium Carbonate-Vitamin D 600-400 MG-UNIT tablet Take by mouth.    . canagliflozin (INVOKANA) 100 MG TABS tablet Take by mouth.    . diclofenac sodium (VOLTAREN) 1 % GEL Apply 2 g topically as needed.    . fluorouracil (EFUDEX) 5 % cream Apply topically 2 (two) times daily.    . Fluticasone-Salmeterol (WIXELA INHUB) 500-50 MCG/DOSE AEPB Inhale 1 puff into the lungs 2 (two) times daily. 60 each 5  . glipiZIDE (GLUCOTROL) 5 MG  tablet Take 1 tablet (5 mg total) by mouth 2 (two) times daily before a meal. 90 tablet 1  . glucose blood (ONETOUCH VERIO) test strip Use as instructed 100 each 12  . Iron-Vitamins (GERITOL PO) Take by mouth.    . Lancets (ONETOUCH DELICA PLUS [id number]) MISC 1 Device by Does not apply route 2 (two) times a day. 200 each 1  . metoprolol succinate (TOPROL-XL) 25 MG 24 hr tablet Take 1 tablet (25 mg total) by mouth daily. 90 tablet 3  . montelukast (SINGULAIR) 10 MG tablet TAKE 1 TABLET BY MOUTH EVERY NIGHT AT BEDTIME 90 tablet 1  . Multiple Vitamins-Iron (MULTI-VITAMIN/IRON PO) Take by mouth.    . nitroGLYCERIN (NITROSTAT) 0.4 MG SL tablet Place 1 tablet (0.4 mg total) under the tongue every 5 (five) minutes as needed for chest pain (up to 3 doses). 25 tablet 1  . pantoprazole (PROTONIX) 40 MG tablet TAKE 1 TABLET BY MOUTH TWICE DAILY 180 tablet 0  . predniSONE (DELTASONE) 10  MG tablet Take 1 tablet (10 mg total) by mouth daily with breakfast. 180 tablet 1  . sacubitril-valsartan (ENTRESTO) 24-26 MG Take 1 tablet by mouth 2 (two) times daily. 180 tablet 3  . furosemide (LASIX) 20 MG tablet Take 1 tablet (20 mg total) by mouth daily. 90 tablet 3  . potassium chloride SA (K-DUR) 20 MEQ tablet Take 2 tablets (40 mEq total) by mouth daily. (Patient not taking: Reported on 08/23/2019) 180 tablet 1   No current facility-administered medications for this visit.      REVIEW OF SYSTEMS (Negative unless checked)  Constitutional: [] Weight loss  [] Fever  [] Chills Cardiac: [] Chest pain   [] Chest pressure   [] Palpitations   [] Shortness of breath when laying flat   [] Shortness of breath at rest   [] Shortness of breath with exertion. Vascular:  [x] Pain in legs with walking   [] Pain in legs at rest   [] Pain in legs when laying flat   [x] Claudication   [] Pain in feet when walking  [] Pain in feet at rest  [] Pain in feet when laying flat   [] History of DVT   [] Phlebitis   [] Swelling in legs   [] Varicose veins   [x] Non-healing ulcers Pulmonary:   [] Uses home oxygen   [] Productive cough   [] Hemoptysis   [] Wheeze  [] COPD   [] Asthma Neurologic:  [] Dizziness  [] Blackouts   [] Seizures   [] History of stroke   [] History of TIA  [] Aphasia   [] Temporary blindness   [] Dysphagia   [] Weakness or numbness in arms   [] Weakness or numbness in legs Musculoskeletal:  [x] Arthritis   [] Joint swelling   [x] Joint pain   [] Low back pain Hematologic:  [] Easy bruising  [] Easy bleeding   [] Hypercoagulable state   [] Anemic  [] Hepatitis Gastrointestinal:  [] Blood in stool   [] Vomiting blood  [] Gastroesophageal reflux/heartburn   [] Abdominal pain Genitourinary:  [] Chronic kidney disease   [] Difficult urination  [x] Frequent urination  [] Burning with urination   [] Hematuria Skin:  [] Rashes   [x] Ulcers   [x] Wounds Psychological:  [] History of anxiety   []  History of major depression.    Physical Exam BP (!) 90/54    Pulse 76   Ht 6\' 1"  (1.854 m)   Wt 215 lb (97.5 kg)   BMI 28.37 kg/m  Gen:  WD/WN, NAD Head: [place]/AT, No temporalis wasting. Ear/Nose/Throat: Hearing grossly intact, nares w/o erythema or drainage, oropharynx w/o Erythema/Exudate Eyes: Conjunctiva clear, sclera non-icteric  Neck: trachea midline.  No JVD.  Pulmonary:  Good  air movement, respirations not labored, no use of accessory muscles  Cardiac: RRR, no JVD Vascular:  Vessel Right Left  Radial Palpable Palpable                          DP 1-2+  2+  PT  1+  1+   Gastrointestinal:. No masses, surgical incisions, or scars. Musculoskeletal: M/S 5/5 throughout.  Extremities without ischemic changes.  No deformity or atrophy.  Trace lower extremity edema.  Scabbed over area on the tip of the right great toe. Neurologic: Sensation grossly intact in extremities.  Symmetrical.  Speech is fluent. Motor exam as listed above. Psychiatric: Judgment intact, Mood & affect appropriate for pt's clinical situation. Dermatologic: Scabbed over area on the tip of the right great toe    Radiology No results found.  Labs No results found for this or any previous visit (from the past 2160 hour(s)).  Assessment/Plan:  Atrial fibrillation (HCC) On anticoagulation, rate controlled  Essential hypertension blood pressure control important in reducing the progression of atherosclerotic disease. On appropriate oral medications.   Type 2 diabetes mellitus with foot ulcer, without long-term current use of insulin (HCC) blood glucose control important in reducing the progression of atherosclerotic disease. Also, involved in wound healing. On appropriate medications.   Hyperlipidemia lipid control important in reducing the progression of atherosclerotic disease. Continue statin therapy   Atherosclerosis of native arteries of the extremities with ulceration ([place]) The patient has undergone an ABI which I have independently reviewed.  The  right ABI is significantly reduced at 0.75 while the left ABI was in the normal range at 1.2.  The study was done at rest. If for just claudication symptoms, that would be a less worrisome situation.  With a recurring ulceration on the right foot, this is actually a limb threatening situation and for that reason an angiogram with potential revascularization should be considered.  He does have a history of prostate cancer and may have some degree of a hypercoagulable situation as well as this is metastatic and not curable.  I discussed the risks and benefits of angiography with possible revascularization.  I discussed the reason and rationale for treatment.  I have discussed the procedure in detail and that this would be expected to be an outpatient procedure.  The patient voices understanding.  He wants to go home and discuss with family before scheduling the angiogram.      Leotis Pain 08/23/2019, 11:55 AM   This note was created with Dragon medical transcription system.  Any errors from dictation are unintentional.

## 2019-08-23 NOTE — Assessment & Plan Note (Signed)
blood glucose control important in reducing the progression of atherosclerotic disease. Also, involved in wound healing. On appropriate medications.  

## 2019-08-23 NOTE — Patient Instructions (Signed)
Peripheral Vascular Disease  Peripheral vascular disease (PVD) is a disease of the blood vessels that are not part of your heart and brain. A simple term for PVD is poor circulation. In most cases, PVD narrows the blood vessels that carry blood from your heart to the rest of your body. This can reduce the supply of blood to your arms, legs, and internal organs, like your stomach or kidneys. However, PVD most often affects a person's lower legs and feet. Without treatment, PVD tends to get worse. PVD can also lead to acute ischemic limb. This is when an arm or leg suddenly cannot get enough blood. This is a medical emergency. Follow these instructions at home: Lifestyle  Do not use any products that contain nicotine or tobacco, such as cigarettes and e-cigarettes. If you need help quitting, ask your doctor.  Lose weight if you are overweight. Or, stay at a healthy weight as told by your doctor.  Eat a diet that is low in fat and cholesterol. If you need help, ask your doctor.  Exercise regularly. Ask your doctor for activities that are right for you. General instructions  Take over-the-counter and prescription medicines only as told by your doctor.  Take good care of your feet: ? Wear comfortable shoes that fit well. ? Check your feet often for any cuts or sores.  Keep all follow-up visits as told by your doctor This is important. Contact a doctor if:  You have cramps in your legs when you walk.  You have leg pain when you are at rest.  You have coldness in a leg or foot.  Your skin changes.  You are unable to get or have an erection (erectile dysfunction).  You have cuts or sores on your feet that do not heal. Get help right away if:  Your arm or leg turns cold, numb, and blue.  Your arms or legs become red, warm, swollen, painful, or numb.  You have chest pain.  You have trouble breathing.  You suddenly have weakness in your face, arm, or leg.  You become very  confused or you cannot speak.  You suddenly have a very bad headache.  You suddenly cannot see. Summary  Peripheral vascular disease (PVD) is a disease of the blood vessels.  A simple term for PVD is poor circulation. Without treatment, PVD tends to get worse.  Treatment may include exercise, low fat and low cholesterol diet, and quitting smoking. This information is not intended to replace advice given to you by your health care provider. Make sure you discuss any questions you have with your health care provider. Document Revised: 02/03/2017 Document Reviewed: 03/31/2016 Elsevier Patient Education  2020 Elsevier Inc.  

## 2019-08-23 NOTE — Assessment & Plan Note (Signed)
The patient has undergone an ABI which I have independently reviewed.  The right ABI is significantly reduced at 0.75 while the left ABI was in the normal range at 1.2.  The study was done at rest. If for just claudication symptoms, that would be a less worrisome situation.  With a recurring ulceration on the right foot, this is actually a limb threatening situation and for that reason an angiogram with potential revascularization should be considered.  He does have a history of prostate cancer and may have some degree of a hypercoagulable situation as well as this is metastatic and not curable.  I discussed the risks and benefits of angiography with possible revascularization.  I discussed the reason and rationale for treatment.  I have discussed the procedure in detail and that this would be expected to be an outpatient procedure.  The patient voices understanding.  He wants to go home and discuss with family before scheduling the angiogram.

## 2019-08-23 NOTE — Assessment & Plan Note (Signed)
blood pressure control important in reducing the progression of atherosclerotic disease. On appropriate oral medications.  

## 2019-08-23 NOTE — Assessment & Plan Note (Signed)
On anticoagulation, rate controlled

## 2019-08-30 ENCOUNTER — Other Ambulatory Visit: Payer: Self-pay | Admitting: Nurse Practitioner

## 2019-08-30 DIAGNOSIS — E11621 Type 2 diabetes mellitus with foot ulcer: Secondary | ICD-10-CM

## 2019-08-30 NOTE — Telephone Encounter (Signed)
Phone call to pt.  Advised that we rec'd a refill request from the pharmacy to refill his Glipizide.  Advised pt. That he has not been evaluated at [facility] since 10/2018.  The pt. Stated he has a different PCP; Dr. Ola Spurr in [place].  Advised pt. To request [facility] phamacy to send his refill request to his new PCP.  Verb. Understanding.

## 2019-09-21 ENCOUNTER — Other Ambulatory Visit: Payer: Self-pay | Admitting: Family Medicine

## 2019-09-21 DIAGNOSIS — K219 Gastro-esophageal reflux disease without esophagitis: Secondary | ICD-10-CM

## 2019-09-21 NOTE — Telephone Encounter (Signed)
Requested  medications are  due for refill today yes  Requested medications are on the active medication list yes  Last refill 2/16   Future visit scheduled   Notes to clinic failed protocol of no visit within 12 months

## 2019-09-23 ENCOUNTER — Ambulatory Visit (INDEPENDENT_AMBULATORY_CARE_PROVIDER_SITE_OTHER): Payer: Medicare Other

## 2019-09-23 ENCOUNTER — Other Ambulatory Visit: Payer: Self-pay

## 2019-09-23 DIAGNOSIS — I480 Paroxysmal atrial fibrillation: Secondary | ICD-10-CM | POA: Diagnosis not present

## 2019-09-23 DIAGNOSIS — I5020 Unspecified systolic (congestive) heart failure: Secondary | ICD-10-CM | POA: Diagnosis not present

## 2019-09-23 MED ORDER — PERFLUTREN LIPID MICROSPHERE
1.0000 mL | INTRAVENOUS | Status: AC | PRN
  Administered 2019-09-23: 2 mL via INTRAVENOUS

## 2019-09-24 LAB — ECHOCARDIOGRAM COMPLETE
AR max vel: 3.56 cm2
AV Peak grad: 6.2 mmHg
Ao pk vel: 1.24 m/s
Area-P 1/2: 2.45 cm2
Calc EF: 30.5 %
S' Lateral: 4 cm
Single Plane A2C EF: 38.3 %
Single Plane A4C EF: 28.9 %

## 2019-09-25 ENCOUNTER — Telehealth: Payer: Self-pay

## 2019-09-25 NOTE — Telephone Encounter (Signed)
-----   Message from Kate Sable, MD sent at 09/24/2019 10:43 AM EDT ----- EF 30 to 35%.  Keep follow-up appointment.

## 2019-09-25 NOTE — Telephone Encounter (Signed)
Left a VM requesting a call back to review echocardiogram results.

## 2019-09-30 ENCOUNTER — Encounter: Payer: Self-pay | Admitting: Cardiology

## 2019-09-30 ENCOUNTER — Other Ambulatory Visit: Payer: Self-pay

## 2019-09-30 ENCOUNTER — Ambulatory Visit (INDEPENDENT_AMBULATORY_CARE_PROVIDER_SITE_OTHER): Payer: Medicare Other | Admitting: Cardiology

## 2019-09-30 VITALS — BP 98/50 | HR 68 | Ht 73.0 in | Wt 216.2 lb

## 2019-09-30 DIAGNOSIS — I480 Paroxysmal atrial fibrillation: Secondary | ICD-10-CM | POA: Diagnosis not present

## 2019-09-30 DIAGNOSIS — I25810 Atherosclerosis of coronary artery bypass graft(s) without angina pectoris: Secondary | ICD-10-CM

## 2019-09-30 DIAGNOSIS — I5020 Unspecified systolic (congestive) heart failure: Secondary | ICD-10-CM

## 2019-09-30 NOTE — Patient Instructions (Signed)
Medication Instructions:  Your physician recommends that you continue on your current medications as directed. Please refer to the Current Medication list given to you today.  *If you need a refill on your cardiac medications before your next appointment, please call your pharmacy*   Lab Work: None ordered If you have labs (blood work) drawn today and your tests are completely normal, you will receive your results only by: [name] MyChart Message (if you have MyChart) OR . A paper copy in the mail If you have any lab test that is abnormal or we need to change your treatment, we will call you to review the results.   Testing/Procedures: None ordered   Follow-Up: At [facility], you and your health needs are our priority.  As part of our continuing mission to provide you with exceptional heart care, we have created designated Provider Care Teams.  These Care Teams include your primary Cardiologist (physician) and Advanced Practice Providers (APPs -  Physician Assistants and Nurse Practitioners) who all work together to provide you with the care you need, when you need it.  We recommend signing up for the patient portal called "MyChart".  Sign up information is provided on this After Visit Summary.  MyChart is used to connect with patients for Virtual Visits (Telemedicine).  Patients are able to view lab/test results, encounter notes, upcoming appointments, etc.  Non-urgent messages can be sent to your provider as well.   To learn more about what you can do with MyChart, go to [email or web address].    Your next appointment:   3 months   You have been referred to EP Dr. Virl Axe for ICD consideration   The format for your next appointment:   In Person  Provider:    You may see Kate Sable, MD or one of the following Advanced Practice Providers on your designated Care Team:    Murray Hodgkins, NP  Christell Faith, PA-C  Marrianne Mood, PA-C    Other  Instructions N/A

## 2019-09-30 NOTE — Progress Notes (Signed)
Cardiology Office Note:    Date:  09/30/2019   ID:  Joshua Roth, DOB 04-06-1940, MRN [id number]  PCP:  Leonel Ramsay, MD  Cardiologist:  Kate Sable, MD  Electrophysiologist:  None   Referring MD: Leonel Ramsay, MD   Chief Complaint  Patient presents with  . OTHER    3 month f/u echo. Meds reviewed verbally with pt.    History of Present Illness:    Joshua Roth is a 79 y.o. male with a hx of asthma, prostate cancer with mets to bone, COPD, parox A. fib, CAD/CABG x3 in 2019, ICM EF 25-30%, PAD who presents for follow-up.  Patient being seen due to heart failure, medication titration.  Low blood pressure has prevented up titration of medication after last visit.  Echocardiogram was ordered to evaluate ejection fraction.  He states having symptoms of claudication with right calf pain when he walks over the past several weeks.  He has seen vascular surgery, evaluated with an ABI with right of 0.75.  He is scheduled to see vascular surgery as follow-up.  He otherwise is tolerating his heart failure medications without any side effects.  Denies edema or shortness of breath.   Historical notes echocardiogram on 01/2019 showed severely reduced ejection fraction with EF 25 to 30%. In 2017, he was diagnosed with atrial fibrillation and started on Eliquis.  He is on chronic steroid use for his asthma/COPD symptoms.  He has an albuterol rescue inhaler which he rarely uses due to lack of symptoms.      Past Medical History:  Diagnosis Date  . A-fib ([place])   . Anemia   . Asthma   . COPD (chronic obstructive pulmonary disease) ([place])   . Coronary artery disease   . Diabetes mellitus without complication ([place])   . GERD (gastroesophageal reflux disease)   . Heart disease   . Hyperlipidemia     Past Surgical History:  Procedure Laterality Date  . CARDIAC CATHETERIZATION    . CORONARY ARTERY BYPASS GRAFT     cabg x 3 in 2019 at [facility]  . NECK SURGERY      . REPLACEMENT TOTAL HIP W/  RESURFACING IMPLANTS  2010   Left, Right   . WRIST FRACTURE SURGERY Left     Current Medications: Current Meds  Medication Sig  . acetaminophen (TYLENOL) 500 MG tablet Take by mouth.  [name] albuterol (VENTOLIN HFA) 108 (90 Base) MCG/ACT inhaler Inhale 2 puffs into the lungs every 6 (six) hours as needed for wheezing or shortness of breath.  [name] apixaban (ELIQUIS) 5 MG TABS tablet Take 1 tablet (5 mg total) by mouth 2 (two) times daily.  [name] atorvastatin (LIPITOR) 80 MG tablet TAKE 1 TABLET BY MOUTH EVERY DAY  . bicalutamide (CASODEX) 50 MG tablet Take by mouth.  . Calcium Carbonate-Vitamin D 600-400 MG-UNIT tablet Take by mouth.  . canagliflozin (INVOKANA) 100 MG TABS tablet Take 200 mg by mouth in the morning and at bedtime.   . diclofenac sodium (VOLTAREN) 1 % GEL Apply 2 g topically as needed.  . fluorouracil (EFUDEX) 5 % cream Apply topically 2 (two) times daily.  . Fluticasone-Salmeterol (WIXELA INHUB) 500-50 MCG/DOSE AEPB Inhale 1 puff into the lungs 2 (two) times daily.  . furosemide (LASIX) 20 MG tablet Take 1 tablet (20 mg total) by mouth daily.  [name] glipiZIDE (GLUCOTROL) 5 MG tablet Take 1 tablet (5 mg total) by mouth 2 (two) times daily before a meal.  . glucose blood (ONETOUCH  VERIO) test strip Use as instructed  . Iron-Vitamins (GERITOL PO) Take by mouth.  . Lancets (ONETOUCH DELICA PLUS [id number]) MISC 1 Device by Does not apply route 2 (two) times a day.  . metoprolol succinate (TOPROL-XL) 25 MG 24 hr tablet Take 1 tablet (25 mg total) by mouth daily.  . montelukast (SINGULAIR) 10 MG tablet TAKE 1 TABLET BY MOUTH EVERY NIGHT AT BEDTIME  . Multiple Vitamins-Iron (MULTI-VITAMIN/IRON PO) Take by mouth.  . nitroGLYCERIN (NITROSTAT) 0.4 MG SL tablet Place 1 tablet (0.4 mg total) under the tongue every 5 (five) minutes as needed for chest pain (up to 3 doses).  . pantoprazole (PROTONIX) 40 MG tablet TAKE 1 TABLET BY MOUTH TWICE DAILY  . potassium chloride SA  (K-DUR) 20 MEQ tablet Take 2 tablets (40 mEq total) by mouth daily.  . predniSONE (DELTASONE) 10 MG tablet Take 1 tablet (10 mg total) by mouth daily with breakfast.  . sacubitril-valsartan (ENTRESTO) 24-26 MG Take 1 tablet by mouth 2 (two) times daily.     Allergies:   Patient has no known allergies.   Social History   Socioeconomic History  . Marital status: Married    Spouse name: Not on file  . Number of children: Not on file  . Years of education: Not on file  . Highest education level: Not on file  Occupational History  . Occupation: retired    Comment: From [employer] x 49 years  Tobacco Use  . Smoking status: Never Smoker  . Smokeless tobacco: Never Used  Vaping Use  . Vaping Use: Never used  Substance and Sexual Activity  . Alcohol use: Yes    Alcohol/week: 2.0 standard drinks    Types: 2 Cans of beer per week  . Drug use: Never  . Sexual activity: Yes    Birth control/protection: None  Other Topics Concern  . Not on file  Social History Narrative  . Not on file   Social Determinants of Health   Financial Resource Strain:   . Difficulty of Paying Living Expenses:   Food Insecurity:   . Worried About Charity fundraiser in the Last Year:   . Arboriculturist in the Last Year:   Transportation Needs:   . Film/video editor (Medical):   [name] Lack of Transportation (Non-Medical):   Physical Activity: Inactive  . Days of Exercise per Week: 0 days  . Minutes of Exercise per Session: 0 min  Stress:   . Feeling of Stress :   Social Connections:   . Frequency of Communication with Friends and Family:   . Frequency of Social Gatherings with Friends and Family:   . Attends Religious Services:   . Active Member of Clubs or Organizations:   . Attends Archivist Meetings:   [name] Marital Status:      Family History: The patient's He was adopted. Family history is unknown by patient.  ROS:   Please see the history of present illness.     All other systems  reviewed and are negative.  EKGs/Labs/Other Studies Reviewed:    The following studies were reviewed today:  Echocardiogram dated 01/11/2019 1. Left ventricular ejection fraction, by visual estimation, is 25 to 30%. The left ventricle has severely decreased function. There is borderline left ventricular hypertrophy.  2. Left ventricular diastolic parameters are indeterminate.  3. Mildly dilated left ventricular internal cavity size.  4. The left ventricle demonstrates global hypokinesis.  5. Global right ventricle has mildly reduced systolic function.The  right ventricular size is normal. No increase in right ventricular wall thickness.  6. Left atrial size was mildly dilated.  7. Moderately elevated pulmonary artery systolic pressure.  8. The inferior vena cava is dilated in size with >50% respiratory variability, suggesting right atrial pressure of 8 mmHg.   EKG:  EKG ordered today shows sinus rhythm, nonspecific intraventricular conduction delay  Recent Labs: 03/24/2019: ALT 13; BUN 34; Creatinine, Ser 1.67; Hemoglobin 13.1; Platelets 130; Potassium 4.8; Sodium 141  Recent Lipid Panel No results found for: CHOL, TRIG, HDL, CHOLHDL, VLDL, LDLCALC, LDLDIRECT  Physical Exam:    VS:  BP (!) 98/50 (BP Location: Left Arm, Patient Position: Sitting, Cuff Size: Normal)   Pulse 68   Ht 6\' 1"  (1.854 m)   Wt (!) 216 lb 4 oz (98.1 kg)   SpO2 97%   BMI 28.53 kg/m     Wt Readings from Last 3 Encounters:  09/30/19 (!) 216 lb 4 oz (98.1 kg)  08/23/19 215 lb (97.5 kg)  06/17/19 208 lb 2 oz (94.4 kg)     GEN:  Well nourished, well developed in no acute distress HEENT: Normal NECK: No JVD; No carotid bruits LYMPHATICS: No lymphadenopathy CARDIAC: Irregular beats, non-tachycardic., no murmurs, rubs, gallops RESPIRATORY:  Clear to auscultation without rales, wheezing or rhonchi  ABDOMEN: Soft, non-tender, non-distended MUSCULOSKELETAL:  No edema; No deformity  SKIN: Warm and  dry NEUROLOGIC:  Alert and oriented x 3 PSYCHIATRIC:  Normal affect   ASSESSMENT:    1. Heart failure with reduced ejection fraction ([place])   2. Coronary artery disease involving coronary bypass graft of native heart without angina pectoris   3. PAF (paroxysmal atrial fibrillation) ([facility])    PLAN:    In order of problems listed above:  1. History of ischemic cardiomyopathy, last echocardiogram with EF 30 to 35% slightly improved from last.  Patient is euvolemic.  BP remains on the low side.  Continue Toprol-XL 25 mg daily,  Entresto 24/26 mg twice daily, Lasix to 20 mg daily.  Patient On SGLTi.  Due to persistently low ejection fraction, will refer patient to electrophysiology for ICD consideration. 2. History of CAD/CABG x3 in 2019.  Patient currently asymptomatic. continue Lipitor daily.  He is on Eliquis.  3. History of paroxysmal A. fib, chadsvasc score of 5 (chf, dm, age,cad).  Currently in sinus.  Toprol, Eliquis 5 mg twice daily.    Follow-up in 3 months   Total encounter time 40 minutes  Greater than 50% was spent in counseling and coordination of care with the patient    This note was generated in part or whole with voice recognition software. Voice recognition is usually quite accurate but there are transcription errors that can and very often do occur. I apologize for any typographical errors that were not detected and corrected.  Medication Adjustments/Labs and Tests Ordered: Current medicines are reviewed at length with the patient today.  Concerns regarding medicines are outlined above.  Orders Placed This Encounter  Procedures  . Ambulatory referral to Cardiac Electrophysiology  . EKG 12-Lead   No orders of the defined types were placed in this encounter.   Patient Instructions  Medication Instructions:  Your physician recommends that you continue on your current medications as directed. Please refer to the Current Medication list given to you today.  *If you  need a refill on your cardiac medications before your next appointment, please call your pharmacy*   Lab Work: None ordered If you have labs (blood  work) drawn today and your tests are completely normal, you will receive your results only by: [name] MyChart Message (if you have MyChart) OR . A paper copy in the mail If you have any lab test that is abnormal or we need to change your treatment, we will call you to review the results.   Testing/Procedures: None ordered   Follow-Up: At [facility], you and your health needs are our priority.  As part of our continuing mission to provide you with exceptional heart care, we have created designated Provider Care Teams.  These Care Teams include your primary Cardiologist (physician) and Advanced Practice Providers (APPs -  Physician Assistants and Nurse Practitioners) who all work together to provide you with the care you need, when you need it.  We recommend signing up for the patient portal called "MyChart".  Sign up information is provided on this After Visit Summary.  MyChart is used to connect with patients for Virtual Visits (Telemedicine).  Patients are able to view lab/test results, encounter notes, upcoming appointments, etc.  Non-urgent messages can be sent to your provider as well.   To learn more about what you can do with MyChart, go to [email or web address].    Your next appointment:   3 months   You have been referred to EP Dr. Virl Axe for ICD consideration   The format for your next appointment:   In Person  Provider:    You may see Kate Sable, MD or one of the following Advanced Practice Providers on your designated Care Team:    Murray Hodgkins, NP  Christell Faith, PA-C  Marrianne Mood, PA-C    Other Instructions N/A     Signed, Kate Sable, MD  09/30/2019 12:27 PM    [place]

## 2019-10-01 ENCOUNTER — Telehealth (INDEPENDENT_AMBULATORY_CARE_PROVIDER_SITE_OTHER): Payer: Self-pay

## 2019-10-01 NOTE — Telephone Encounter (Signed)
Spoke with the patient and he is scheduled with Dr. Lucky Cowboy on 10/14/19 with a 10:15 am arrival time to the [facility]. Covid testing on 10/11/19 between 8-1 pm at the [facility]. Pre-procedure instructions were discussed and will be mailed.

## 2019-10-11 ENCOUNTER — Other Ambulatory Visit
Admission: RE | Admit: 2019-10-11 | Discharge: 2019-10-11 | Disposition: A | Discharge status: Home / Self Care | Payer: Medicare Other | Source: Ambulatory Visit | Attending: Vascular Surgery | Admitting: Vascular Surgery

## 2019-10-11 ENCOUNTER — Other Ambulatory Visit: Payer: Self-pay

## 2019-10-11 DIAGNOSIS — Z20822 Contact with and (suspected) exposure to covid-19: Secondary | ICD-10-CM | POA: Insufficient documentation

## 2019-10-11 DIAGNOSIS — Z01812 Encounter for preprocedural laboratory examination: Secondary | ICD-10-CM | POA: Diagnosis present

## 2019-10-11 LAB — SARS CORONAVIRUS 2 (TAT 6-24 HRS): SARS Coronavirus 2: NEGATIVE

## 2019-10-13 ENCOUNTER — Other Ambulatory Visit (INDEPENDENT_AMBULATORY_CARE_PROVIDER_SITE_OTHER): Payer: Self-pay | Admitting: Nurse Practitioner

## 2019-10-14 ENCOUNTER — Other Ambulatory Visit: Payer: Self-pay

## 2019-10-14 ENCOUNTER — Encounter: Payer: Self-pay | Admitting: Vascular Surgery

## 2019-10-14 ENCOUNTER — Encounter
Admission: RE | Disposition: A | Discharge status: Home / Self Care | Payer: Self-pay | Source: Home / Self Care | Attending: Vascular Surgery

## 2019-10-14 ENCOUNTER — Ambulatory Visit
Admission: RE | Admit: 2019-10-14 | Discharge: 2019-10-14 | Disposition: A | Discharge status: Home / Self Care | Payer: Medicare Other | Attending: Vascular Surgery | Admitting: Vascular Surgery

## 2019-10-14 DIAGNOSIS — I4891 Unspecified atrial fibrillation: Secondary | ICD-10-CM | POA: Diagnosis not present

## 2019-10-14 DIAGNOSIS — I70211 Atherosclerosis of native arteries of extremities with intermittent claudication, right leg: Secondary | ICD-10-CM

## 2019-10-14 DIAGNOSIS — I2510 Atherosclerotic heart disease of native coronary artery without angina pectoris: Secondary | ICD-10-CM | POA: Diagnosis not present

## 2019-10-14 DIAGNOSIS — L97519 Non-pressure chronic ulcer of other part of right foot with unspecified severity: Secondary | ICD-10-CM | POA: Insufficient documentation

## 2019-10-14 DIAGNOSIS — J449 Chronic obstructive pulmonary disease, unspecified: Secondary | ICD-10-CM | POA: Diagnosis not present

## 2019-10-14 DIAGNOSIS — I70235 Atherosclerosis of native arteries of right leg with ulceration of other part of foot: Secondary | ICD-10-CM | POA: Insufficient documentation

## 2019-10-14 DIAGNOSIS — D649 Anemia, unspecified: Secondary | ICD-10-CM | POA: Diagnosis not present

## 2019-10-14 DIAGNOSIS — E11621 Type 2 diabetes mellitus with foot ulcer: Secondary | ICD-10-CM | POA: Diagnosis not present

## 2019-10-14 DIAGNOSIS — Z79899 Other long term (current) drug therapy: Secondary | ICD-10-CM | POA: Insufficient documentation

## 2019-10-14 DIAGNOSIS — K219 Gastro-esophageal reflux disease without esophagitis: Secondary | ICD-10-CM | POA: Insufficient documentation

## 2019-10-14 DIAGNOSIS — Z7901 Long term (current) use of anticoagulants: Secondary | ICD-10-CM | POA: Insufficient documentation

## 2019-10-14 DIAGNOSIS — Z7952 Long term (current) use of systemic steroids: Secondary | ICD-10-CM | POA: Insufficient documentation

## 2019-10-14 DIAGNOSIS — E1151 Type 2 diabetes mellitus with diabetic peripheral angiopathy without gangrene: Secondary | ICD-10-CM | POA: Insufficient documentation

## 2019-10-14 DIAGNOSIS — I70299 Other atherosclerosis of native arteries of extremities, unspecified extremity: Secondary | ICD-10-CM

## 2019-10-14 DIAGNOSIS — Z7984 Long term (current) use of oral hypoglycemic drugs: Secondary | ICD-10-CM | POA: Diagnosis not present

## 2019-10-14 DIAGNOSIS — E785 Hyperlipidemia, unspecified: Secondary | ICD-10-CM | POA: Insufficient documentation

## 2019-10-14 DIAGNOSIS — M79661 Pain in right lower leg: Secondary | ICD-10-CM | POA: Insufficient documentation

## 2019-10-14 DIAGNOSIS — Z7951 Long term (current) use of inhaled steroids: Secondary | ICD-10-CM | POA: Insufficient documentation

## 2019-10-14 DIAGNOSIS — C61 Malignant neoplasm of prostate: Secondary | ICD-10-CM | POA: Diagnosis not present

## 2019-10-14 DIAGNOSIS — I10 Essential (primary) hypertension: Secondary | ICD-10-CM | POA: Diagnosis not present

## 2019-10-14 HISTORY — PX: LOWER EXTREMITY ANGIOGRAPHY: CATH118251

## 2019-10-14 LAB — BUN: BUN: 29 mg/dL — ABNORMAL HIGH (ref 8–23)

## 2019-10-14 LAB — GLUCOSE, CAPILLARY: Glucose-Capillary: 117 mg/dL — ABNORMAL HIGH (ref 70–99)

## 2019-10-14 LAB — CREATININE, SERUM
Creatinine, Ser: 1.75 mg/dL — ABNORMAL HIGH (ref 0.61–1.24)
GFR calc Af Amer: 42 mL/min — ABNORMAL LOW (ref >60)
GFR calc non Af Amer: 36 mL/min — ABNORMAL LOW (ref >60)

## 2019-10-14 SURGERY — LOWER EXTREMITY ANGIOGRAPHY
Anesthesia: Moderate Sedation | Site: Leg Lower | Laterality: Right

## 2019-10-14 MED ORDER — ASPIRIN EC 81 MG PO TBEC
81.0000 mg | DELAYED_RELEASE_TABLET | Freq: Every day | ORAL | Status: DC
  Administered 2019-10-14: 81 mg via ORAL

## 2019-10-14 MED ORDER — SODIUM CHLORIDE 0.9% FLUSH: 3.0000 mL | INTRAVENOUS | Status: DC | PRN

## 2019-10-14 MED ORDER — SODIUM CHLORIDE 0.9 % IV SOLN: INTRAVENOUS | Status: DC

## 2019-10-14 MED ORDER — IODIXANOL 320 MG/ML IV SOLN
INTRAVENOUS | Status: DC | PRN
  Administered 2019-10-14: 50 mL via INTRA_ARTERIAL

## 2019-10-14 MED ORDER — FENTANYL CITRATE (PF) 100 MCG/2ML IJ SOLN
INTRAMUSCULAR | Status: AC
  Filled 2019-10-14: qty 2

## 2019-10-14 MED ORDER — HYDRALAZINE HCL 20 MG/ML IJ SOLN: 5.0000 mg | INTRAMUSCULAR | Status: DC | PRN

## 2019-10-14 MED ORDER — CEFAZOLIN SODIUM-DEXTROSE 2-4 GM/100ML-% IV SOLN
INTRAVENOUS | Status: AC
  Administered 2019-10-14: 2 g via INTRAVENOUS
  Filled 2019-10-14: qty 100

## 2019-10-14 MED ORDER — HEPARIN SODIUM (PORCINE) 1000 UNIT/ML IJ SOLN
INTRAMUSCULAR | Status: AC
  Filled 2019-10-14: qty 1

## 2019-10-14 MED ORDER — HYDROMORPHONE HCL 1 MG/ML IJ SOLN: 1.0000 mg | Freq: Once | INTRAMUSCULAR | Status: DC | PRN

## 2019-10-14 MED ORDER — DIPHENHYDRAMINE HCL 50 MG/ML IJ SOLN: 50.0000 mg | Freq: Once | INTRAMUSCULAR | Status: DC | PRN

## 2019-10-14 MED ORDER — ASPIRIN EC 81 MG PO TBEC
DELAYED_RELEASE_TABLET | ORAL | Status: AC
  Filled 2019-10-14: qty 1

## 2019-10-14 MED ORDER — ONDANSETRON HCL 4 MG/2ML IJ SOLN: 4.0000 mg | Freq: Four times a day (QID) | INTRAMUSCULAR | Status: DC | PRN

## 2019-10-14 MED ORDER — MIDAZOLAM HCL 5 MG/5ML IJ SOLN
INTRAMUSCULAR | Status: AC
  Filled 2019-10-14: qty 5

## 2019-10-14 MED ORDER — FENTANYL CITRATE (PF) 100 MCG/2ML IJ SOLN
INTRAMUSCULAR | Status: DC | PRN
  Administered 2019-10-14: 25 ug via INTRAVENOUS
  Administered 2019-10-14: 50 ug via INTRAVENOUS

## 2019-10-14 MED ORDER — MIDAZOLAM HCL 2 MG/2ML IJ SOLN
INTRAMUSCULAR | Status: DC | PRN
  Administered 2019-10-14: 2 mg via INTRAVENOUS
  Administered 2019-10-14: 1 mg via INTRAVENOUS

## 2019-10-14 MED ORDER — CEFAZOLIN SODIUM-DEXTROSE 2-4 GM/100ML-% IV SOLN: 2.0000 g | Freq: Once | INTRAVENOUS | Status: AC

## 2019-10-14 MED ORDER — SODIUM CHLORIDE 0.9 % IV SOLN: 250.0000 mL | INTRAVENOUS | Status: DC | PRN

## 2019-10-14 MED ORDER — ALTEPLASE 2 MG IJ SOLR
INTRAMUSCULAR | Status: DC | PRN
  Administered 2019-10-14: 4 mg

## 2019-10-14 MED ORDER — ALTEPLASE 2 MG IJ SOLR
INTRAMUSCULAR | Status: AC
  Filled 2019-10-14: qty 4

## 2019-10-14 MED ORDER — FAMOTIDINE 20 MG PO TABS: 40.0000 mg | ORAL_TABLET | Freq: Once | ORAL | Status: DC | PRN

## 2019-10-14 MED ORDER — LABETALOL HCL 5 MG/ML IV SOLN: 10.0000 mg | INTRAVENOUS | Status: DC | PRN

## 2019-10-14 MED ORDER — ASPIRIN EC 81 MG PO TBEC: 81.0000 mg | DELAYED_RELEASE_TABLET | Freq: Every day | ORAL | 2 refills | Status: DC

## 2019-10-14 MED ORDER — ACETAMINOPHEN 325 MG PO TABS: 650.0000 mg | ORAL_TABLET | ORAL | Status: DC | PRN

## 2019-10-14 MED ORDER — METHYLPREDNISOLONE SODIUM SUCC 125 MG IJ SOLR: 125.0000 mg | Freq: Once | INTRAMUSCULAR | Status: DC | PRN

## 2019-10-14 MED ORDER — MIDAZOLAM HCL 2 MG/ML PO SYRP: 8.0000 mg | ORAL_SOLUTION | Freq: Once | ORAL | Status: DC | PRN

## 2019-10-14 MED ORDER — SODIUM CHLORIDE 0.9% FLUSH: 3.0000 mL | Freq: Two times a day (BID) | INTRAVENOUS | Status: DC

## 2019-10-14 SURGICAL SUPPLY — 20 items
BALLN ULTRVRSE 018 2.5X150X150 (BALLOONS) ×3
BALLN ULTRVRSE 5X10XX130C (BALLOONS) ×3
BALLOON ULTRVRSE 5X10XX130C (BALLOONS) ×1 IMPLANT
BALLOON ULTRVS 018 2.5X150X150 (BALLOONS) ×1 IMPLANT
CANISTER PENUMBRA ENGINE (MISCELLANEOUS) ×3 IMPLANT
CATH ANGIO 5F PIGTAIL 65CM (CATHETERS) ×3 IMPLANT
CATH LIGHTNING 7 XTORQ 130 (CATHETERS) ×3 IMPLANT
CATH VERT 5FR 125CM (CATHETERS) ×3 IMPLANT
COVER PROBE U/S 5X48 (MISCELLANEOUS) ×3 IMPLANT
DEVICE INFLAT 30 PLUS (MISCELLANEOUS) ×3 IMPLANT
DEVICE STARCLOSE SE CLOSURE (Vascular Products) ×3 IMPLANT
GLIDEWIRE ADV .035X260CM (WIRE) ×3 IMPLANT
PACK ANGIOGRAPHY (CUSTOM PROCEDURE TRAY) ×3 IMPLANT
SHEATH BRITE TIP 5FRX11 (SHEATH) ×3 IMPLANT
SHEATH FLEXOR ANSEL2 7FRX45 (SHEATH) ×3 IMPLANT
STENT VIABAHN 7X100X120 (Permanent Stent) ×3 IMPLANT
SYR MEDRAD MARK 7 150ML (SYRINGE) ×3 IMPLANT
TUBING CONTRAST HIGH PRESS 72 (TUBING) ×3 IMPLANT
WIRE G V18X300CM (WIRE) ×3 IMPLANT
WIRE J 3MM .035X145CM (WIRE) ×3 IMPLANT

## 2019-10-14 NOTE — Op Note (Signed)
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  Ultrasound guidance for vascular access left femoral artery             2.  Catheter placement into right common femoral artery from left femoral approach             3.  Aortogram and selective right lower extremity angiogram             4.   Mechanical thrombectomy to the right popliteal artery, tibioperoneal trunk, and posterior tibial arteries with the penumbra CAT 7 device             5.   Viabahn stent placement to the right popliteal artery with 7 mm diameter by 10 cm length stent  6.  Percutaneous transluminal angioplasty of the right tibioperoneal trunk and posterior tibial arteries with 2.5 mm diameter by 15 cm length angioplasty balloon             7.  StarClose closure device left femoral artery  EBL: 200 cc  Contrast: 50 cc  Fluoro Time: 7 minutes  Moderate Conscious Sedation Time: approximately 35 minutes using 3 mg of Versed and 75 mcg of Fentanyl              Indications:  Patient is a 79 y.o.male with recurring ulceration of the right foot pain with activity. The patient has noninvasive study showing a significant reduction in the right ABI. The patient is brought in for angiography for further evaluation and potential treatment.  Due to the limb threatening nature of the situation, angiogram was performed for attempted limb salvage. The patient is aware that if the procedure fails, amputation would be expected.  The patient also understands that even with successful revascularization, amputation may still be required due to the severity of the situation.  Risks and benefits are discussed and informed consent is obtained.   Procedure:  The patient was  identified and appropriate procedural time out was performed.  The patient was then placed supine on the table and prepped and draped in the usual sterile fashion. Moderate conscious sedation was administered during a face to face encounter with the patient throughout the procedure with my supervision of the RN administering medicines and monitoring the patient's vital signs, pulse oximetry, telemetry and mental status throughout from the start of the procedure until the patient was taken to the recovery room. Ultrasound was used to evaluate the left common femoral artery.  It was patent .  A digital ultrasound image was acquired.  A Seldinger needle was used to access the left common femoral artery under direct ultrasound guidance and a permanent image was performed.  A 0.035 J wire was advanced without resistance and a 5Fr sheath was placed.  Pigtail catheter was placed into the aorta and an AP aortogram was performed. This demonstrated normal renal arteries and normal aorta and iliac segments without significant stenosis. I then crossed the aortic bifurcation and advanced to the right femoral head. Selective right lower extremity angiogram was then performed. This demonstrated fairly normal common femoral artery, profunda femoris artery, and superficial femoral artery.  There was an abrupt occlusion in the above-knee popliteal artery that was clearly thrombotic/embolic in nature.  This  involved the origins of all 3 tibial vessels.  There did appear to be reconstitution of at least the posterior tibial and anterior tibial arteries in the proximal to mid segments. It was felt that it was in the patient's best interest to proceed with intervention after these images to avoid a second procedure and a larger amount of contrast and fluoroscopy based off of the findings from the initial angiogram. The patient was systemically heparinized and a 7 [place] Ansell sheath was then placed over the [employer] wire. I then  used a Kumpe catheter and the advantage wire to cross the popliteal occlusion relatively easily and get down into the posterior tibial artery where selective imaging showed to be patent from the proximal to mid posterior tibial down.  I then placed a V 18 wire and used 4 mg of TPA through the Kumpe catheter in the proximal posterior tibial artery in the popliteal artery I then remove the diagnostic catheter and proceeded with intervention.  The penumbra CAT 7 catheter was then brought onto the field and 2 passes were made throughout the right popliteal artery, tibioperoneal trunk, and the proximal portion of the posterior tibial artery.  This resulted in a marked improvement with only a small amount of residual thrombus seen in the proximal to mid posterior tibial artery creating greater than 70% stenosis.  The proximal to mid popliteal artery had stenosis that may have been a native stenosis that created the area of thrombus.  Around this was a small to medium amount of residual thrombus.  I elected to place a covered stent in this area of the popliteal artery and to perform angioplasty of the posterior tibial artery.  A 7 mm diameter by 10 cm length Viabahn stent was deployed in the popliteal artery encompassing this lesion and covering it several centimeters proximal and distal.  This was postdilated with a 5 mm balloon with excellent angiographic completion result and less than 10% residual stenosis the posterior tibial artery and tibioperoneal trunk were addressed with a 2.5 mm diameter by 15 cm length angioplasty balloon inflated to 12 atm for 1 minute.  Completion imaging showed brisk two-vessel runoff distally with less than 10% residual stenosis in the posterior tibial artery and the popliteal stent. I elected to terminate the procedure. The sheath was removed and StarClose closure device was deployed in the left femoral artery with excellent hemostatic result. The patient was taken to the recovery room in  stable condition having tolerated the procedure well.  Findings:               Aortogram:  Relatively normal renal arteries, aorta, and iliac arteries without significant stenosis             Right lower Extremity:  Fairly normal common femoral artery, profunda femoris artery, and superficial femoral artery.  There was an abrupt occlusion in the above-knee popliteal artery that was clearly thrombotic/embolic in nature.  This involved the origins of all 3 tibial vessels.  There did appear to be reconstitution of at least the posterior tibial and anterior tibial arteries in the proximal to mid segments.   Disposition: Patient was taken to the recovery room in stable condition having tolerated the procedure well.  Complications: None  Leotis Pain 10/14/2019 2:37 PM   This note was created with Dragon Medical transcription system. Any errors in dictation are purely unintentional.

## 2019-10-14 NOTE — H&P (Signed)
[facility] Admission History & Physical  MRN : [id number]  Joshua Roth is a 79 y.o. (January 03, 1941) male who presents with chief complaint of No chief complaint on file. [name]  History of Present Illness: Patient presents with severe claudication symptoms right worse than left.  No ulcers on the left leg, but the right leg has had a recurring ulceration over the past year or so.  He can only walk very short distances.  Pain is predominantly in the calf and lower leg with activity.  Minimal pain at rest. This prompted his primary care physician to perform an ABI which I have independently reviewed.  The right ABI is significantly reduced at 0.75 while the left ABI was in the normal range at 1.2.  The study was done at rest.  Past Medical History:  Diagnosis Date  . A-fib ([place])   . Anemia   . Asthma   . COPD (chronic obstructive pulmonary disease) ([place])   . Coronary artery disease   . Diabetes mellitus without complication ([place])   . GERD (gastroesophageal reflux disease)   . Heart disease   . Hyperlipidemia          Past Surgical History:  Procedure Laterality Date  . CARDIAC CATHETERIZATION    . CORONARY ARTERY BYPASS GRAFT     cabg x 3 in 2019 at [facility]  . NECK SURGERY    . REPLACEMENT TOTAL HIP W/  RESURFACING IMPLANTS  2010   Left, Right   . WRIST FRACTURE SURGERY Left      Family History  Adopted: Yes  Family history unknown: Yes     Social History        Tobacco Use  . Smoking status: Never Smoker  . Smokeless tobacco: Never Used  Vaping Use  . Vaping Use: Never used  Substance Use Topics  . Alcohol use: Yes    Alcohol/week: 2.0 standard drinks    Types: 2 Cans of beer per week  . Drug use: Never  Retired from [employer] last year and moved to this area with his wife.  No Known Allergies        Current Outpatient Medications  Medication Sig Dispense Refill  . acetaminophen (TYLENOL) 500 MG  tablet Take by mouth.    [name] albuterol (VENTOLIN HFA) 108 (90 Base) MCG/ACT inhaler Inhale 2 puffs into the lungs every 6 (six) hours as needed for wheezing or shortness of breath. 8 g 11  . apixaban (ELIQUIS) 5 MG TABS tablet Take 1 tablet (5 mg total) by mouth 2 (two) times daily. 180 tablet 1  . atorvastatin (LIPITOR) 80 MG tablet TAKE 1 TABLET BY MOUTH EVERY DAY 90 tablet 1  . bicalutamide (CASODEX) 50 MG tablet Take by mouth.    . Calcium Carbonate-Vitamin D 600-400 MG-UNIT tablet Take by mouth.    . canagliflozin (INVOKANA) 100 MG TABS tablet Take by mouth.    . diclofenac sodium (VOLTAREN) 1 % GEL Apply 2 g topically as needed.    . fluorouracil (EFUDEX) 5 % cream Apply topically 2 (two) times daily.    . Fluticasone-Salmeterol (WIXELA INHUB) 500-50 MCG/DOSE AEPB Inhale 1 puff into the lungs 2 (two) times daily. 60 each 5  . glipiZIDE (GLUCOTROL) 5 MG tablet Take 1 tablet (5 mg total) by mouth 2 (two) times daily before a meal. 90 tablet 1  . glucose blood (ONETOUCH VERIO) test strip Use as instructed 100 each 12  . Iron-Vitamins (GERITOL PO) Take by mouth.    [name]  Lancets (ONETOUCH DELICA PLUS [id number]) MISC 1 Device by Does not apply route 2 (two) times a day. 200 each 1  . metoprolol succinate (TOPROL-XL) 25 MG 24 hr tablet Take 1 tablet (25 mg total) by mouth daily. 90 tablet 3  . montelukast (SINGULAIR) 10 MG tablet TAKE 1 TABLET BY MOUTH EVERY NIGHT AT BEDTIME 90 tablet 1  . Multiple Vitamins-Iron (MULTI-VITAMIN/IRON PO) Take by mouth.    . nitroGLYCERIN (NITROSTAT) 0.4 MG SL tablet Place 1 tablet (0.4 mg total) under the tongue every 5 (five) minutes as needed for chest pain (up to 3 doses). 25 tablet 1  . pantoprazole (PROTONIX) 40 MG tablet TAKE 1 TABLET BY MOUTH TWICE DAILY 180 tablet 0  . predniSONE (DELTASONE) 10 MG tablet Take 1 tablet (10 mg total) by mouth daily with breakfast. 180 tablet 1  . sacubitril-valsartan (ENTRESTO) 24-26 MG Take 1 tablet by mouth 2  (two) times daily. 180 tablet 3  . furosemide (LASIX) 20 MG tablet Take 1 tablet (20 mg total) by mouth daily. 90 tablet 3  . potassium chloride SA (K-DUR) 20 MEQ tablet Take 2 tablets (40 mEq total) by mouth daily. (Patient not taking: Reported on 08/23/2019) 180 tablet 1   No current facility-administered medications for this visit.      REVIEW OF SYSTEMS (Negative unless checked)  Constitutional: [] ?Weight loss  [] ?Fever  [] ?Chills Cardiac: [] ?Chest pain   [] ?Chest pressure   [] ?Palpitations   [] ?Shortness of breath when laying flat   [] ?Shortness of breath at rest   [] ?Shortness of breath with exertion. Vascular:  [x] ?Pain in legs with walking   [] ?Pain in legs at rest   [] ?Pain in legs when laying flat   [x] ?Claudication   [] ?Pain in feet when walking  [] ?Pain in feet at rest  [] ?Pain in feet when laying flat   [] ?History of DVT   [] ?Phlebitis   [] ?Swelling in legs   [] ?Varicose veins   [x] ?Non-healing ulcers Pulmonary:   [] ?Uses home oxygen   [] ?Productive cough   [] ?Hemoptysis   [] ?Wheeze  [] ?COPD   [] ?Asthma Neurologic:  [] ?Dizziness  [] ?Blackouts   [] ?Seizures   [] ?History of stroke   [] ?History of TIA  [] ?Aphasia   [] ?Temporary blindness   [] ?Dysphagia   [] ?Weakness or numbness in arms   [] ?Weakness or numbness in legs Musculoskeletal:  [x] ?Arthritis   [] ?Joint swelling   [x] ?Joint pain   [] ?Low back pain Hematologic:  [] ?Easy bruising  [] ?Easy bleeding   [] ?Hypercoagulable state   [] ?Anemic  [] ?Hepatitis Gastrointestinal:  [] ?Blood in stool   [] ?Vomiting blood  [] ?Gastroesophageal reflux/heartburn   [] ?Abdominal pain Genitourinary:  [] ?Chronic kidney disease   [] ?Difficult urination  [x] ?Frequent urination  [] ?Burning with urination   [] ?Hematuria Skin:  [] ?Rashes   [x] ?Ulcers   [x] ?Wounds Psychological:  [] ?History of anxiety   [] ? History of major depression.   Physical Examination  There were no vitals filed for this visit. There is no height or weight on file to  calculate BMI. Gen: WD/WN, NAD Head: [place]/AT, No temporalis wasting.  Ear/Nose/Throat: Hearing grossly intact, nares w/o erythema or drainage, oropharynx w/o Erythema/Exudate,  Eyes: Conjunctiva clear, sclera non-icteric Neck: Trachea midline.  No JVD.  Pulmonary:  Good air movement, respirations not labored, no use of accessory muscles.  Cardiac: RRR, normal S1, S2. Vascular:  Vessel Right Left  Radial Palpable Palpable                          PT 1+ Palpable  Palpable  DP 1+ Palpable Palpable   Gastrointestinal: soft, non-tender/non-distended. No guarding/reflex.  Musculoskeletal: M/S 5/5 throughout.  Extremities without ischemic changes.  No deformity or atrophy.  Neurologic: Sensation grossly intact in extremities.  Symmetrical.  Speech is fluent. Motor exam as listed above. Psychiatric: Judgment intact, Mood & affect appropriate for pt's clinical situation. Dermatologic: No rashes or ulcers noted.  No cellulitis or open wounds.      CBC Lab Results  Component Value Date   WBC 8.6 03/24/2019   HGB 13.1 03/24/2019   HCT 40.7 03/24/2019   MCV 94.2 03/24/2019   PLT 130 (L) 03/24/2019    BMET    Component Value Date/Time   NA 141 03/24/2019 1816   K 4.8 03/24/2019 1816   CL 106 03/24/2019 1816   CO2 25 03/24/2019 1816   GLUCOSE 157 (H) 03/24/2019 1816   BUN 34 (H) 03/24/2019 1816   CREATININE 1.67 (H) 03/24/2019 1816   CALCIUM 9.6 03/24/2019 1816   GFRNONAA 39 (L) 03/24/2019 1816   GFRAA 45 (L) 03/24/2019 1816   CrCl cannot be calculated (Patient's most recent lab result is older than the maximum 21 days allowed.).  COAG No results found for: INR, PROTIME  Radiology ECHOCARDIOGRAM COMPLETE  Result Date: 09/24/2019    ECHOCARDIOGRAM REPORT   Patient Name:   Joshua Roth Date of Exam: 09/23/2019 Medical Rec #:  [id number]       Height:       73.0 in Accession #:    [id number]      Weight:       215.0 lb Date of Birth:  22-Feb-1941      BSA:          2.219 m  Patient Age:    14 years        BP:           100/56 mmHg Patient Gender: M               HR:           63 bpm. Exam Location:  [facility] Procedure: 2D Echo, Cardiac Doppler, Color Doppler, 3D Echo and Intracardiac            Opacification Agent Indications:    I50.20* Unspecified systolic (congestive) heart failure; I48.0                 Paroxysmal atrial fibrillation  History:        Patient has prior history of Echocardiogram examinations, most                 recent 01/11/2019. CHF and Cardiomyopathy, CAD, Prior CABG, PAD                 and COPD, Arrythmias:Atrial Fibrillation; Risk                 Factors:Hypertension, Dyslipidemia and Diabetes.  Sonographer:    Hester Mates BS, RVT, RDCS Referring Phys: [id number] BRIAN AGBOR-ETANG IMPRESSIONS  1. Left ventricular ejection fraction, by estimation, is 30 to 35%. The left ventricle has moderately decreased function. The left ventricle demonstrates global hypokinesis. The left ventricular internal cavity size was borderline dilated. Left ventricular diastolic parameters are consistent with Grade I diastolic dysfunction (impaired relaxation). There is severe hypokinesis of the left ventricular, entire inferior wall.  2. Right ventricular systolic function is mildly reduced. The right ventricular size is normal. There is normal pulmonary artery systolic pressure.  3. The mitral valve is normal in structure.  Trivial mitral valve regurgitation. No evidence of mitral stenosis.  4. The aortic valve is tricuspid. Aortic valve regurgitation is not visualized. Mild aortic valve sclerosis is present, with no evidence of aortic valve stenosis.  5. The inferior vena cava is normal in size with greater than 50% respiratory variability, suggesting right atrial pressure of 3 mmHg. Comparison(s): Previous Echo showed severely reduced ejection fraction with EF 25 to 30% and global mildly reduced RV function. FINDINGS  Left Ventricle: Left ventricular ejection fraction, by  estimation, is 30 to 35%. The left ventricle has moderately decreased function. The left ventricle demonstrates global hypokinesis. Severe hypokinesis of the left ventricular, entire inferior wall. Definity contrast agent was given IV to delineate the left ventricular endocardial borders. The left ventricular internal cavity size was borderline dilated. There is borderline left ventricular hypertrophy. Left ventricular diastolic parameters are consistent with Grade I diastolic dysfunction (impaired relaxation). Right Ventricle: The right ventricular size is normal. No increase in right ventricular wall thickness. Right ventricular systolic function is mildly reduced. There is normal pulmonary artery systolic pressure. The tricuspid regurgitant velocity is 2.20 m/s, and with an assumed right atrial pressure of 3 mmHg, the estimated right ventricular systolic pressure is 63.8 mmHg. Left Atrium: Left atrial size was normal in size. Right Atrium: Right atrial size was normal in size. Pericardium: There is no evidence of pericardial effusion. Mitral Valve: The mitral valve is normal in structure. Trivial mitral valve regurgitation. No evidence of mitral valve stenosis. Tricuspid Valve: The tricuspid valve is normal in structure. Tricuspid valve regurgitation is mild. Aortic Valve: The aortic valve is tricuspid. . There is mild thickening of the aortic valve. Aortic valve regurgitation is not visualized. Mild aortic valve sclerosis is present, with no evidence of aortic valve stenosis. Mild aortic valve annular calcification. There is mild thickening of the aortic valve. Aortic valve peak gradient measures 6.2 mmHg. Pulmonic Valve: The pulmonic valve was grossly normal. Pulmonic valve regurgitation is mild. No evidence of pulmonic stenosis. Aorta: The aortic root and ascending aorta are structurally normal, with no evidence of dilitation. Pulmonary Artery: The pulmonary artery is not well seen. Venous: The inferior vena  cava is normal in size with greater than 50% respiratory variability, suggesting right atrial pressure of 3 mmHg. IAS/Shunts: The interatrial septum was not well visualized.  LEFT VENTRICLE PLAX 2D LVIDd:         5.40 cm      Diastology LVIDs:         4.00 cm      LV e' lateral:   6.64 cm/s LV PW:         0.90 cm      LV E/e' lateral: 7.6 LV IVS:        1.10 cm      LV e' medial:    6.31 cm/s LVOT diam:     2.20 cm      LV E/e' medial:  8.0 LV SV:         89 LV SV Index:   40 LVOT Area:     3.80 cm                              3D Volume EF: LV Volumes (MOD)            3D EF:        33 % LV vol d, MOD A2C: 145.0 ml LV EDV:  252 ml LV vol d, MOD A4C: 166.0 ml LV ESV:       169 ml LV vol s, MOD A2C: 89.5 ml  LV SV:        84 ml LV vol s, MOD A4C: 118.0 ml LV SV MOD A2C:     55.5 ml LV SV MOD A4C:     166.0 ml LV SV MOD BP:      47.1 ml RIGHT VENTRICLE RV Basal diam:  2.60 cm RV Mid diam:    2.20 cm RV S prime:     5.98 cm/s TAPSE (M-mode): 1.6 cm LEFT ATRIUM             Index       RIGHT ATRIUM           Index LA diam:        3.40 cm 1.53 cm/m  RA Area:     12.40 cm LA Vol (A2C):   36.7 ml 16.54 ml/m RA Volume:   27.70 ml  12.48 ml/m LA Vol (A4C):   32.7 ml 14.74 ml/m LA Biplane Vol: 36.4 ml 16.41 ml/m  AORTIC VALVE AV Area (Vmax): 3.56 cm AV Vmax:        124.00 cm/s AV Peak Grad:   6.2 mmHg LVOT Vmax:      116.00 cm/s LVOT Vmean:     82.000 cm/s LVOT VTI:       0.234 m  AORTA Ao Root diam: 3.40 cm Ao Asc diam:  3.50 cm MITRAL VALVE               TRICUSPID VALVE MV Area (PHT): 2.45 cm    TR Peak grad:   19.4 mmHg MV Decel Time: 310 msec    TR Vmax:        220.00 cm/s MV E velocity: 50.70 cm/s MV A velocity: 78.60 cm/s  SHUNTS MV E/A ratio:  0.65        Systemic VTI:  0.23 m                            Systemic Diam: 2.20 cm Nelva Bush MD Electronically signed by Nelva Bush MD Signature Date/Time: 09/24/2019/6:34:11 AM    Final      Assessment/Plan Atrial fibrillation (HCC) On  anticoagulation, rate controlled  Essential hypertension blood pressure control important in reducing the progression of atherosclerotic disease. On appropriate oral medications.   Type 2 diabetes mellitus with foot ulcer, without long-term current use of insulin (HCC) blood glucose control important in reducing the progression of atherosclerotic disease. Also, involved in wound healing. On appropriate medications.   Hyperlipidemia lipid control important in reducing the progression of atherosclerotic disease. Continue statin therapy   Atherosclerosis of native arteries of the extremities with ulceration ([place]) The patient has undergone an ABI which I have independently reviewed.  The right ABI is significantly reduced at 0.75 while the left ABI was in the normal range at 1.2.  The study was done at rest. If for just claudication symptoms, that would be a less worrisome situation.  With a recurring ulceration on the right foot, this is actually a limb threatening situation and for that reason an angiogram with potential revascularization should be considered.  He does have a history of prostate cancer and may have some degree of a hypercoagulable situation as well as this is metastatic and not curable.  I discussed the risks and benefits of angiography with  possible revascularization.  I discussed the reason and rationale for treatment.  I have discussed the procedure in detail and that this would be expected to be an outpatient procedure.  The patient voices understanding.  He is here today for his angiogram and agreeable to proceed.   Leotis Pain, MD  10/14/2019 10:33 AM

## 2019-10-15 ENCOUNTER — Encounter: Payer: Self-pay | Admitting: Vascular Surgery

## 2019-10-15 NOTE — Progress Notes (Signed)
Cardiology Office Note:    Date:  10/16/2019   ID:  [name], DOB 11/26/1940, MRN [id number]  PCP:  Leonel Ramsay, MD  [facility] Cardiologist:  Kate Sable, MD  [place] Electrophysiologist:  Vickie Epley, MD   Referring MD: Kate Sable, MD   Chief Complaint  Patient presents with  . Other    Patient would like to discuss IDC. Meds reviewed verbally with patient.     History of Present Illness:    Joshua Roth is a 79 y.o. male with a hx of paroxysmal atrial fibrillation, coronary artery disease status post CABG x3 in 2019, ischemic cardiomyopathy EF 25%, and peripheral arterial disease status post peripheral intervention October 14, 2019 who presents to the clinic to discuss possible ICD implant.  He denies any symptoms of active ischemia.  For his chronic systolic heart failure due to ischemic cardiomyopathy, he is on a good medical regimen including Entresto, metoprolol, diuretic, SGLT inhibitor.  He recently underwent a lower extremity angiogram and percutaneous revasc and is healing well from that. He tells me that he does have active prostate cancer that has seemingly improved on his current therapy.  Past Medical History:  Diagnosis Date  . A-fib ([place])   . Anemia   . Asthma   . COPD (chronic obstructive pulmonary disease) ([place])   . Coronary artery disease   . Diabetes mellitus without complication ([place])   . GERD (gastroesophageal reflux disease)   . Heart disease   . Hyperlipidemia     Past Surgical History:  Procedure Laterality Date  . CARDIAC CATHETERIZATION    . CORONARY ARTERY BYPASS GRAFT     cabg x 3 in 2019 at [facility]  . LOWER EXTREMITY ANGIOGRAPHY Right 10/14/2019   Procedure: LOWER EXTREMITY ANGIOGRAPHY;  Surgeon: Algernon Huxley, MD;  Location: [place] [facility];  Service: Cardiovascular;  Laterality: Right;  . NECK SURGERY    . REPLACEMENT TOTAL HIP W/  RESURFACING IMPLANTS  2010   Left, Right   .  WRIST FRACTURE SURGERY Left     Current Medications: Current Meds  Medication Sig  . acetaminophen (TYLENOL) 500 MG tablet Take by mouth.  [name] albuterol (VENTOLIN HFA) 108 (90 Base) MCG/ACT inhaler Inhale 2 puffs into the lungs every 6 (six) hours as needed for wheezing or shortness of breath.  [name] apixaban (ELIQUIS) 5 MG TABS tablet Take 1 tablet (5 mg total) by mouth 2 (two) times daily.  [name] aspirin EC 81 MG tablet Take 1 tablet (81 mg total) by mouth daily.  [name] atorvastatin (LIPITOR) 80 MG tablet TAKE 1 TABLET BY MOUTH EVERY DAY  . bicalutamide (CASODEX) 50 MG tablet Take 50 mg by mouth daily.   . Calcium Carbonate-Vitamin D 600-400 MG-UNIT tablet Take by mouth.   . canagliflozin (INVOKANA) 300 MG TABS tablet Take 300 mg by mouth in the morning and at bedtime.   . diclofenac sodium (VOLTAREN) 1 % GEL Apply 2 g topically as needed.  . fluorouracil (EFUDEX) 5 % cream Apply topically 2 (two) times daily.  . Fluticasone-Salmeterol (WIXELA INHUB) 500-50 MCG/DOSE AEPB Inhale 1 puff into the lungs 2 (two) times daily.  [name] glipiZIDE (GLUCOTROL) 5 MG tablet Take 1 tablet (5 mg total) by mouth 2 (two) times daily before a meal.  . glucose blood (ONETOUCH VERIO) test strip Use as instructed  . Iron-Vitamins (GERITOL PO) Take by mouth.   . Lancets (ONETOUCH DELICA PLUS [id number]) MISC 1 Device by Does not apply  route 2 (two) times a day.  . metoprolol succinate (TOPROL-XL) 25 MG 24 hr tablet Take 1 tablet (25 mg total) by mouth daily.  . montelukast (SINGULAIR) 10 MG tablet TAKE 1 TABLET BY MOUTH EVERY NIGHT AT BEDTIME  . Multiple Vitamins-Iron (MULTI-VITAMIN/IRON PO) Take by mouth.   . nitroGLYCERIN (NITROSTAT) 0.4 MG SL tablet Place 1 tablet (0.4 mg total) under the tongue every 5 (five) minutes as needed for chest pain (up to 3 doses).  . pantoprazole (PROTONIX) 40 MG tablet TAKE 1 TABLET BY MOUTH TWICE DAILY  . potassium chloride SA (K-DUR) 20 MEQ tablet Take 2 tablets (40 mEq total) by mouth daily.    . predniSONE (DELTASONE) 10 MG tablet Take 1 tablet (10 mg total) by mouth daily with breakfast.  . sacubitril-valsartan (ENTRESTO) 24-26 MG Take 1 tablet by mouth 2 (two) times daily.     Allergies:   Patient has no known allergies.   Social History   Socioeconomic History  . Marital status: Married    Spouse name: Not on file  . Number of children: Not on file  . Years of education: Not on file  . Highest education level: Not on file  Occupational History  . Occupation: retired    Comment: From [employer] x 49 years  Tobacco Use  . Smoking status: Never Smoker  . Smokeless tobacco: Never Used  Vaping Use  . Vaping Use: Never used  Substance and Sexual Activity  . Alcohol use: Yes    Comment: 2 beers a month  . Drug use: Never  . Sexual activity: Yes    Birth control/protection: None  Other Topics Concern  . Not on file  Social History Narrative  . Not on file   Social Determinants of Health   Financial Resource Strain:   . Difficulty of Paying Living Expenses:   Food Insecurity:   . Worried About Charity fundraiser in the Last Year:   . Arboriculturist in the Last Year:   Transportation Needs:   . Film/video editor (Medical):   [name] Lack of Transportation (Non-Medical):   Physical Activity: Inactive  . Days of Exercise per Week: 0 days  . Minutes of Exercise per Session: 0 min  Stress:   . Feeling of Stress :   Social Connections:   . Frequency of Communication with Friends and Family:   . Frequency of Social Gatherings with Friends and Family:   . Attends Religious Services:   . Active Member of Clubs or Organizations:   . Attends Archivist Meetings:   [name] Marital Status:      Family History: The patient's He was adopted. Family history is unknown by patient.  ROS:   Please see the history of present illness.    All other systems reviewed and are negative.  EKGs/Labs/Other Studies Reviewed:    The following studies were reviewed today: ECG,  echocardiogram.  EKG on September 30, 2019 reviewed and reveals QRS duration of approximately 155-160 ms and a right bundle branch pattern morphology.  PR is slightly prolonged at just over 200 ms.  EKG:  EKG is ordered today.  The ekg ordered today demonstrates significant artifact. Appears to be sinus rhythm underlying the artifact (P waves best seen in V4, V5, V3).   Echocardiogram: Personally reviewed by me.  Left ventricular function is significantly reduced, EF visually estimated to be 30%.  Global hypokinesis/dyskinesis of the left ventricle.  Mild right ventricular dysfunction.  Recent Labs:  03/24/2019: ALT 13; Hemoglobin 13.1; Platelets 130; Potassium 4.8; Sodium 141 10/14/2019: BUN 29; Creatinine, Ser 1.75  Recent Lipid Panel No results found for: CHOL, TRIG, HDL, CHOLHDL, VLDL, LDLCALC, LDLDIRECT  Physical Exam:    VS:  BP (!) 80/50 (BP Location: Right Arm, Patient Position: Sitting, Cuff Size: Normal)   Pulse 80   Ht 6\' 1"  (1.854 m)   Wt 215 lb (97.5 kg)   SpO2 91%   BMI 28.37 kg/m     Wt Readings from Last 3 Encounters:  10/16/19 215 lb (97.5 kg)  10/14/19 213 lb (96.6 kg)  09/30/19 (!) 216 lb 4 oz (98.1 kg)     GEN: Well nourished, well developed in no acute distress. In wheelchair. HEENT: Normal NECK: No JVD; No carotid bruits LYMPHATICS: No lymphadenopathy CARDIAC: RRR, no murmurs, rubs, gallops RESPIRATORY:  Clear to auscultation without rales, wheezing or rhonchi  ABDOMEN: Soft, non-tender, non-distended MUSCULOSKELETAL:  No edema; No deformity  SKIN: Warm and dry NEUROLOGIC:  Alert and oriented x 3 PSYCHIATRIC:  Normal affect   ASSESSMENT:    1. Heart failure with reduced ejection fraction ([place])   2. PAF (paroxysmal atrial fibrillation) ([place])   3. Right bundle branch block   4. First degree AV block    PLAN:    In order of problems listed above:  1. Chronic systolic heart failure 2/2 ischemic heart disease (EF30%). Given the patient's persistently low  EF in the setting of good medical therapy, I feel that an ICD is indicated. The patient has conduction disease with a prolonged PR and RBBB > 158msec. Given the dyskinesis on surface echo and degree of conduction disease, recommend a CRT-D system. I have discussed the procedure with the patient and he would like to talk it over with his family before making a final decision. I will plan to see him back in clinic in 1 month to answer any questions, etc. 2. Paroxysmal atrial fibrillation. Continue anticoagulation.    Medication Adjustments/Labs and Tests Ordered: Current medicines are reviewed at length with the patient today.  Concerns regarding medicines are outlined above.   Orders Placed This Encounter  Procedures  . EKG 12-Lead   No orders of the defined types were placed in this encounter.   Patient Instructions  Medication Instructions:  Your physician recommends that you continue on your current medications as directed. Please refer to the Current Medication list given to you today.  Labwork: None ordered.  Testing/Procedures: None ordered.  Follow-Up: Your physician wants you to follow-up in: one month with Dr. Quentin Ore.   Any Other Special Instructions Will Be Listed Below (If Applicable).  If you need a refill on your cardiac medications before your next appointment, please call your pharmacy.      Signed, Vickie Epley, MD  10/16/2019 9:55 AM    [place]

## 2019-10-16 ENCOUNTER — Encounter: Payer: Self-pay | Admitting: Cardiology

## 2019-10-16 ENCOUNTER — Other Ambulatory Visit: Payer: Self-pay

## 2019-10-16 ENCOUNTER — Ambulatory Visit (INDEPENDENT_AMBULATORY_CARE_PROVIDER_SITE_OTHER): Payer: Medicare Other | Admitting: Cardiology

## 2019-10-16 VITALS — BP 80/50 | HR 80 | Ht 73.0 in | Wt 215.0 lb

## 2019-10-16 DIAGNOSIS — I4510 Unspecified right bundle-branch block: Secondary | ICD-10-CM

## 2019-10-16 DIAGNOSIS — I480 Paroxysmal atrial fibrillation: Secondary | ICD-10-CM

## 2019-10-16 DIAGNOSIS — I440 Atrioventricular block, first degree: Secondary | ICD-10-CM | POA: Diagnosis not present

## 2019-10-16 DIAGNOSIS — I5020 Unspecified systolic (congestive) heart failure: Secondary | ICD-10-CM

## 2019-10-16 NOTE — Patient Instructions (Addendum)
Medication Instructions:  Your physician recommends that you continue on your current medications as directed. Please refer to the Current Medication list given to you today.  Labwork: None ordered.  Testing/Procedures: None ordered.  Follow-Up: Your physician wants you to follow-up in: one month with Dr. Quentin Ore.   Any Other Special Instructions Will Be Listed Below (If Applicable).  If you need a refill on your cardiac medications before your next appointment, please call your pharmacy.

## 2019-10-17 ENCOUNTER — Institutional Professional Consult (permissible substitution): Payer: Medicare Other | Admitting: Internal Medicine

## 2019-11-15 ENCOUNTER — Ambulatory Visit (INDEPENDENT_AMBULATORY_CARE_PROVIDER_SITE_OTHER): Payer: Medicare Other | Admitting: Vascular Surgery

## 2019-11-15 ENCOUNTER — Encounter (INDEPENDENT_AMBULATORY_CARE_PROVIDER_SITE_OTHER): Payer: Medicare Other

## 2019-11-20 ENCOUNTER — Ambulatory Visit: Payer: Medicare Other | Admitting: Cardiology

## 2019-12-09 ENCOUNTER — Ambulatory Visit (INDEPENDENT_AMBULATORY_CARE_PROVIDER_SITE_OTHER): Payer: Medicare Other | Admitting: Cardiology

## 2019-12-09 ENCOUNTER — Other Ambulatory Visit: Payer: Self-pay

## 2019-12-09 ENCOUNTER — Encounter: Payer: Self-pay | Admitting: Cardiology

## 2019-12-09 VITALS — BP 82/52 | HR 75 | Ht 73.0 in | Wt 204.0 lb

## 2019-12-09 DIAGNOSIS — I480 Paroxysmal atrial fibrillation: Secondary | ICD-10-CM

## 2019-12-09 DIAGNOSIS — I5020 Unspecified systolic (congestive) heart failure: Secondary | ICD-10-CM

## 2019-12-09 DIAGNOSIS — I25810 Atherosclerosis of coronary artery bypass graft(s) without angina pectoris: Secondary | ICD-10-CM

## 2019-12-09 MED ORDER — LOSARTAN POTASSIUM 25 MG PO TABS
25.0000 mg | ORAL_TABLET | Freq: Every day | ORAL | 3 refills | Status: DC
Start: 2019-12-09 — End: 2020-05-08

## 2019-12-09 MED ORDER — LOSARTAN POTASSIUM 25 MG PO TABS: 25.0000 mg | ORAL_TABLET | Freq: Every day | ORAL | 5 refills | Status: DC

## 2019-12-09 NOTE — Progress Notes (Signed)
Cardiology Office Note:    Date:  12/09/2019   ID:  Joshua Roth, DOB 04-Jun-1940, MRN [id number]  PCP:  Leonel Ramsay, MD  Cardiologist:  Kate Sable, MD  Electrophysiologist:  Vickie Epley, MD   Referring MD: Leonel Ramsay, MD   Chief Complaint  Patient presents with  . Follow-up    Pt c/o feeling tired, states when he is doing physical therapy he gets SOB. States no more CP since hospital.    History of Present Illness:    Joshua Roth is a 79 y.o. male with a hx of asthma, prostate cancer with mets to bone, COPD, parox A. fib, CAD/CABG x3 in 2019, ICM EF 25-30%, PAD who presents for follow-up.  Patient being seen due to ischemic cardiomyopathy, last EF 30%.  He was referred to electrophysiology for ICD consideration.  Due to right bundle branch block, CRT-D system is being considered.  Patient was admitted with Covid pneumonia at [facility] a month ago.  Has not followed up with EP.  Is planning on making a follow-up appointment.  His blood pressures have been running low and associated with dizziness.  Systolic blood pressure sometimes when he walks is 60s to 80s associated with dizziness.  Denies edema.  Takes Lasix 20 mg daily.  Historical notes echocardiogram on 01/2019 showed severely reduced ejection fraction with EF 25 to 30%. In 2017, he was diagnosed with atrial fibrillation and started on Eliquis.  He is on chronic steroid use for his asthma/COPD symptoms.  He has an albuterol rescue inhaler which he rarely uses due to lack of symptoms.      Past Medical History:  Diagnosis Date  . A-fib ([place])   . Anemia   . Asthma   . COPD (chronic obstructive pulmonary disease) ([place])   . Coronary artery disease   . Diabetes mellitus without complication ([place])   . GERD (gastroesophageal reflux disease)   . Heart disease   . Hyperlipidemia     Past Surgical History:  Procedure Laterality Date  . CARDIAC CATHETERIZATION    . CORONARY ARTERY BYPASS GRAFT      cabg x 3 in 2019 at [facility]  . LOWER EXTREMITY ANGIOGRAPHY Right 10/14/2019   Procedure: LOWER EXTREMITY ANGIOGRAPHY;  Surgeon: Algernon Huxley, MD;  Location: [facility];  Service: Cardiovascular;  Laterality: Right;  . NECK SURGERY    . REPLACEMENT TOTAL HIP W/  RESURFACING IMPLANTS  2010   Left, Right   . WRIST FRACTURE SURGERY Left     Current Medications: Current Meds  Medication Sig  . acetaminophen (TYLENOL) 500 MG tablet Take by mouth.  [name] albuterol (VENTOLIN HFA) 108 (90 Base) MCG/ACT inhaler Inhale 2 puffs into the lungs every 6 (six) hours as needed for wheezing or shortness of breath.  [name] apixaban (ELIQUIS) 5 MG TABS tablet Take 1 tablet (5 mg total) by mouth 2 (two) times daily.  [name] aspirin EC 81 MG tablet Take 1 tablet (81 mg total) by mouth daily.  [name] atorvastatin (LIPITOR) 80 MG tablet TAKE 1 TABLET BY MOUTH EVERY DAY  . bicalutamide (CASODEX) 50 MG tablet Take 50 mg by mouth daily.   . Calcium Carbonate-Vitamin D 600-400 MG-UNIT tablet Take by mouth.   . canagliflozin (INVOKANA) 300 MG TABS tablet Take 300 mg by mouth in the morning and at bedtime.   . diclofenac sodium (VOLTAREN) 1 % GEL Apply 2 g topically as needed.  . fluorouracil (EFUDEX) 5 % cream Apply topically  2 (two) times daily.  . Fluticasone-Salmeterol (WIXELA INHUB) 500-50 MCG/DOSE AEPB Inhale 1 puff into the lungs 2 (two) times daily.  [name] glipiZIDE (GLUCOTROL) 5 MG tablet Take 1 tablet (5 mg total) by mouth 2 (two) times daily before a meal.  . glucose blood (ONETOUCH VERIO) test strip Use as instructed  . Iron-Vitamins (GERITOL PO) Take by mouth.   . Lancets (ONETOUCH DELICA PLUS [id number]) MISC 1 Device by Does not apply route 2 (two) times a day.  . metoprolol succinate (TOPROL-XL) 25 MG 24 hr tablet Take 1 tablet (25 mg total) by mouth daily.  . montelukast (SINGULAIR) 10 MG tablet TAKE 1 TABLET BY MOUTH EVERY NIGHT AT BEDTIME  . Multiple Vitamins-Iron (MULTI-VITAMIN/IRON PO) Take by  mouth.   . nitroGLYCERIN (NITROSTAT) 0.4 MG SL tablet Place 1 tablet (0.4 mg total) under the tongue every 5 (five) minutes as needed for chest pain (up to 3 doses).  . pantoprazole (PROTONIX) 40 MG tablet TAKE 1 TABLET BY MOUTH TWICE DAILY  . potassium chloride SA (K-DUR) 20 MEQ tablet Take 2 tablets (40 mEq total) by mouth daily.  . predniSONE (DELTASONE) 10 MG tablet Take 1 tablet (10 mg total) by mouth daily with breakfast.  . sacubitril-valsartan (ENTRESTO) 24-26 MG Take 1 tablet by mouth 2 (two) times daily.     Allergies:   Patient has no known allergies.   Social History   Socioeconomic History  . Marital status: Married    Spouse name: Not on file  . Number of children: Not on file  . Years of education: Not on file  . Highest education level: Not on file  Occupational History  . Occupation: retired    Comment: From [employer] x 49 years  Tobacco Use  . Smoking status: Never Smoker  . Smokeless tobacco: Never Used  Vaping Use  . Vaping Use: Never used  Substance and Sexual Activity  . Alcohol use: Yes    Comment: 2 beers a month  . Drug use: Never  . Sexual activity: Yes    Birth control/protection: None  Other Topics Concern  . Not on file  Social History Narrative  . Not on file   Social Determinants of Health   Financial Resource Strain:   . Difficulty of Paying Living Expenses: Not on file  Food Insecurity:   . Worried About Charity fundraiser in the Last Year: Not on file  . Ran Out of Food in the Last Year: Not on file  Transportation Needs:   . Lack of Transportation (Medical): Not on file  . Lack of Transportation (Non-Medical): Not on file  Physical Activity: Inactive  . Days of Exercise per Week: 0 days  . Minutes of Exercise per Session: 0 min  Stress:   . Feeling of Stress : Not on file  Social Connections:   . Frequency of Communication with Friends and Family: Not on file  . Frequency of Social Gatherings with Friends and Family: Not on file  .  Attends Religious Services: Not on file  . Active Member of Clubs or Organizations: Not on file  . Attends Archivist Meetings: Not on file  . Marital Status: Not on file     Family History: The patient's He was adopted. Family history is unknown by patient.  ROS:   Please see the history of present illness.     All other systems reviewed and are negative.  EKGs/Labs/Other Studies Reviewed:    The following studies were  reviewed today:  Echocardiogram dated 01/11/2019 1. Left ventricular ejection fraction, by visual estimation, is 25 to 30%. The left ventricle has severely decreased function. There is borderline left ventricular hypertrophy.  2. Left ventricular diastolic parameters are indeterminate.  3. Mildly dilated left ventricular internal cavity size.  4. The left ventricle demonstrates global hypokinesis.  5. Global right ventricle has mildly reduced systolic function.The right ventricular size is normal. No increase in right ventricular wall thickness.  6. Left atrial size was mildly dilated.  7. Moderately elevated pulmonary artery systolic pressure.  8. The inferior vena cava is dilated in size with >50% respiratory variability, suggesting right atrial pressure of 8 mmHg.   EKG:  EKG not ordered today   Recent Labs: 03/24/2019: ALT 13; Hemoglobin 13.1; Platelets 130; Potassium 4.8; Sodium 141 10/14/2019: BUN 29; Creatinine, Ser 1.75  Recent Lipid Panel No results found for: CHOL, TRIG, HDL, CHOLHDL, VLDL, LDLCALC, LDLDIRECT  Physical Exam:    VS:  BP (!) 82/52   Pulse 75   Ht 6\' 1"  (1.854 m)   Wt 204 lb (92.5 kg)   BMI 26.91 kg/m     Wt Readings from Last 3 Encounters:  12/09/19 204 lb (92.5 kg)  10/16/19 215 lb (97.5 kg)  10/14/19 213 lb (96.6 kg)     GEN:  Well nourished, well developed in no acute distress HEENT: Normal NECK: No JVD; No carotid bruits LYMPHATICS: No lymphadenopathy CARDIAC: Regular rate rhythm, no murmurs, rubs,  gallops RESPIRATORY:  Clear to auscultation without rales, wheezing or rhonchi  ABDOMEN: Soft, non-tender, non-distended MUSCULOSKELETAL:  No edema; No deformity  SKIN: Warm and dry NEUROLOGIC:  Alert and oriented x 3 PSYCHIATRIC:  Normal affect   ASSESSMENT:    1. Heart failure with reduced ejection fraction ([place])   2. Coronary artery disease involving coronary bypass graft of native heart without angina pectoris   3. PAF (paroxysmal atrial fibrillation) ([facility])    PLAN:    In order of problems listed above:  1. ischemic cardiomyopathy, last echocardiogram with EF 30 to 35% keep appointment with EP for ICD consideration.  ECG with right bundle branch block.  CRT-D being considered.  Patient is euvolemic.  BP remains on the low side.  Continue Toprol-XL 25 mg daily, start losartan 25 mg daily, stop Entresto due to low blood pressures.  2. History of CAD/CABG x3 in 2019.  Patient currently asymptomatic. continue Lipitor daily. on Eliquis.  3. History of paroxysmal A. fib, chadsvasc score of 5 (chf, dm, age,cad).  Heart rate appears regular on exam.  Continue.  Toprol, Eliquis 5 mg twice daily.    Follow-up in 1 month  Total encounter time 40 minutes  Greater than 50% was spent in counseling and coordination of care with the patient    This note was generated in part or whole with voice recognition software. Voice recognition is usually quite accurate but there are transcription errors that can and very often do occur. I apologize for any typographical errors that were not detected and corrected.  Medication Adjustments/Labs and Tests Ordered: Current medicines are reviewed at length with the patient today.  Concerns regarding medicines are outlined above.  No orders of the defined types were placed in this encounter.  Meds ordered this encounter  Medications  . DISCONTD: losartan (COZAAR) 25 MG tablet    Sig: Take 1 tablet (25 mg total) by mouth daily.    Dispense:  30 tablet     Refill:  5  .  losartan (COZAAR) 25 MG tablet    Sig: Take 1 tablet (25 mg total) by mouth daily.    Dispense:  90 tablet    Refill:  3    Patient Instructions  Medication Instructions:  Your physician has recommended you make the following change in your medication:   1)  STOP taking your sacubitril-valsartan (ENTRESTO) 24-26 MG. 2)  START taking losartan (COZAAR) 25 MG tablet: Take 1 tablet (25 mg total) by mouth daily.   *If you need a refill on your cardiac medications before your next appointment, please call your pharmacy*   Lab Work: None Ordered If you have labs (blood work) drawn today and your tests are completely normal, you will receive your results only by: [name] MyChart Message (if you have MyChart) OR . A paper copy in the mail If you have any lab test that is abnormal or we need to change your treatment, we will call you to review the results.   Testing/Procedures: None Ordered   Follow-Up: At [facility], you and your health needs are our priority.  As part of our continuing mission to provide you with exceptional heart care, we have created designated Provider Care Teams.  These Care Teams include your primary Cardiologist (physician) and Advanced Practice Providers (APPs -  Physician Assistants and Nurse Practitioners) who all work together to provide you with the care you need, when you need it.  We recommend signing up for the patient portal called "MyChart".  Sign up information is provided on this After Visit Summary.  MyChart is used to connect with patients for Virtual Visits (Telemedicine).  Patients are able to view lab/test results, encounter notes, upcoming appointments, etc.  Non-urgent messages can be sent to your provider as well.   To learn more about what you can do with MyChart, go to [email or web address].    Your next appointment:   4-6 weeks   The format for your next appointment:   In Person  Provider:   Kate Sable,  MD   Other Instructions     Signed, Kate Sable, MD  12/09/2019 5:30 PM    [place]

## 2019-12-09 NOTE — Patient Instructions (Signed)
Medication Instructions:  Your physician has recommended you make the following change in your medication:   1)  STOP taking your sacubitril-valsartan (ENTRESTO) 24-26 MG. 2)  START taking losartan (COZAAR) 25 MG tablet: Take 1 tablet (25 mg total) by mouth daily.   *If you need a refill on your cardiac medications before your next appointment, please call your pharmacy*   Lab Work: None Ordered If you have labs (blood work) drawn today and your tests are completely normal, you will receive your results only by: [name] MyChart Message (if you have MyChart) OR . A paper copy in the mail If you have any lab test that is abnormal or we need to change your treatment, we will call you to review the results.   Testing/Procedures: None Ordered   Follow-Up: At [facility], you and your health needs are our priority.  As part of our continuing mission to provide you with exceptional heart care, we have created designated Provider Care Teams.  These Care Teams include your primary Cardiologist (physician) and Advanced Practice Providers (APPs -  Physician Assistants and Nurse Practitioners) who all work together to provide you with the care you need, when you need it.  We recommend signing up for the patient portal called "MyChart".  Sign up information is provided on this After Visit Summary.  MyChart is used to connect with patients for Virtual Visits (Telemedicine).  Patients are able to view lab/test results, encounter notes, upcoming appointments, etc.  Non-urgent messages can be sent to your provider as well.   To learn more about what you can do with MyChart, go to [email or web address].    Your next appointment:   4-6 weeks   The format for your next appointment:   In Person  Provider:   Kate Sable, MD   Other Instructions

## 2019-12-16 ENCOUNTER — Other Ambulatory Visit: Payer: Self-pay

## 2019-12-16 MED ORDER — APIXABAN 5 MG PO TABS: 5.0000 mg | ORAL_TABLET | Freq: Two times a day (BID) | ORAL | 1 refills | Status: DC

## 2020-01-06 ENCOUNTER — Ambulatory Visit: Payer: Medicare Other | Admitting: Cardiology

## 2020-01-13 ENCOUNTER — Other Ambulatory Visit: Payer: Self-pay

## 2020-01-13 ENCOUNTER — Encounter: Payer: Self-pay | Admitting: Cardiology

## 2020-01-13 ENCOUNTER — Ambulatory Visit (INDEPENDENT_AMBULATORY_CARE_PROVIDER_SITE_OTHER): Payer: Medicare Other | Admitting: Cardiology

## 2020-01-13 VITALS — BP 120/60 | HR 76 | Ht 73.0 in | Wt 209.5 lb

## 2020-01-13 DIAGNOSIS — I25810 Atherosclerosis of coronary artery bypass graft(s) without angina pectoris: Secondary | ICD-10-CM

## 2020-01-13 DIAGNOSIS — I480 Paroxysmal atrial fibrillation: Secondary | ICD-10-CM | POA: Diagnosis not present

## 2020-01-13 DIAGNOSIS — I5020 Unspecified systolic (congestive) heart failure: Secondary | ICD-10-CM | POA: Diagnosis not present

## 2020-01-13 NOTE — Patient Instructions (Signed)
Medication Instructions:  Your physician recommends that you continue on your current medications as directed. Please refer to the Current Medication list given to you today.  *If you need a refill on your cardiac medications before your next appointment, please call your pharmacy*   Lab Work: None Ordered If you have labs (blood work) drawn today and your tests are completely normal, you will receive your results only by: . MyChart Message (if you have MyChart) OR . A paper copy in the mail If you have any lab test that is abnormal or we need to change your treatment, we will call you to review the results.   Testing/Procedures: None Ordered   Follow-Up: At [facility], you and your health needs are our priority.  As part of our continuing mission to provide you with exceptional heart care, we have created designated Provider Care Teams.  These Care Teams include your primary Cardiologist (physician) and Advanced Practice Providers (APPs -  Physician Assistants and Nurse Practitioners) who all work together to provide you with the care you need, when you need it.  We recommend signing up for the patient portal called "MyChart".  Sign up information is provided on this After Visit Summary.  MyChart is used to connect with patients for Virtual Visits (Telemedicine).  Patients are able to view lab/test results, encounter notes, upcoming appointments, etc.  Non-urgent messages can be sent to your provider as well.   To learn more about what you can do with MyChart, go to [email or web address].    Your next appointment:   3 month(s)  The format for your next appointment:   In Person  Provider:   Brian Agbor-Etang, MD   Other Instructions   

## 2020-01-13 NOTE — Progress Notes (Signed)
Cardiology Office Note:    Date:  01/13/2020   ID:  Joshua Roth, DOB Nov 21, 1940, MRN [id number]  PCP:  Joshua Ramsay, MD  Cardiologist:  Kate Sable, MD  Electrophysiologist:  Vickie Epley, MD   Referring MD: Joshua Ramsay, MD   Chief Complaint  Patient presents with  . OTHER    4-6 wk f/u no complaints today. Meds reviewed verbally with pt.    History of Present Illness:    Joshua Roth is a 79 y.o. male with a hx of asthma, prostate cancer with mets to bone, COPD, parox A. fib, CAD/CABG x3 in 2019, ICM EF 25-30%, PAD who presents for follow-up.    Being seen for ischemic cardiomyopathy, EF 30%.  Blood pressures were running low, patient had dizzy spells.  Could not tolerate Entresto.  Entresto was stopped, losartan started.  Blood pressures better with resolution of dizziness.  Systolic blood pressure checks at home ranges from 70s to low 100s.  Predominantly around 510, 258 systolic.  Has appointment to see electrophysiology for ICD consideration.  That was delayed due to recent hospitalization.  Wife has anxiety, worried about driving in [place], will hopefully like to see EP at [place].  He feels well, has no other concerns at this time.  Historical notes echocardiogram on 01/2019 showed severely reduced ejection fraction with EF 25 to 30%. In 2017, he was diagnosed with atrial fibrillation and started on Eliquis.  He is on chronic steroid use for his asthma/COPD symptoms.  He has an albuterol rescue inhaler which he rarely uses due to lack of symptoms.    Past Medical History:  Diagnosis Date  . A-fib ([place])   . Anemia   . Asthma   . COPD (chronic obstructive pulmonary disease) ([place])   . Coronary artery disease   . Diabetes mellitus without complication ([place])   . GERD (gastroesophageal reflux disease)   . Heart disease   . Hyperlipidemia     Past Surgical History:  Procedure Laterality Date  . CARDIAC CATHETERIZATION    . CORONARY  ARTERY BYPASS GRAFT     cabg x 3 in 2019 at [facility]  . LOWER EXTREMITY ANGIOGRAPHY Right 10/14/2019   Procedure: LOWER EXTREMITY ANGIOGRAPHY;  Surgeon: Algernon Huxley, MD;  Location: [place] [facility];  Service: Cardiovascular;  Laterality: Right;  . NECK SURGERY    . REPLACEMENT TOTAL HIP W/  RESURFACING IMPLANTS  2010   Left, Right   . WRIST FRACTURE SURGERY Left     Current Medications: Current Meds  Medication Sig  . acetaminophen (TYLENOL) 500 MG tablet Take by mouth.  [name] albuterol (VENTOLIN HFA) 108 (90 Base) MCG/ACT inhaler Inhale 2 puffs into the lungs every 6 (six) hours as needed for wheezing or shortness of breath.  [name] apixaban (ELIQUIS) 5 MG TABS tablet Take 1 tablet (5 mg total) by mouth 2 (two) times daily.  [name] aspirin EC 81 MG tablet Take 1 tablet (81 mg total) by mouth daily.  [name] atorvastatin (LIPITOR) 80 MG tablet TAKE 1 TABLET BY MOUTH EVERY DAY  . bicalutamide (CASODEX) 50 MG tablet Take 50 mg by mouth daily.   . Calcium Carbonate-Vitamin D 600-400 MG-UNIT tablet Take by mouth.   . canagliflozin (INVOKANA) 300 MG TABS tablet Take 300 mg by mouth in the morning and at bedtime.   . diclofenac sodium (VOLTAREN) 1 % GEL Apply 2 g topically as needed.  . fluorouracil (EFUDEX) 5 % cream Apply topically 2 (two) times  daily.  . Fluticasone-Salmeterol (WIXELA INHUB) 500-50 MCG/DOSE AEPB Inhale 1 puff into the lungs 2 (two) times daily.  . furosemide (LASIX) 20 MG tablet Take 1 tablet (20 mg total) by mouth daily. (Patient taking differently: Take 20 mg by mouth as needed. )  . glipiZIDE (GLUCOTROL) 5 MG tablet Take 1 tablet (5 mg total) by mouth 2 (two) times daily before a meal. (Patient taking differently: Take by mouth. Takes 5 mg am and 2.5 mg pm daily.)  . glucose blood (ONETOUCH VERIO) test strip Use as instructed  . Iron-Vitamins (GERITOL PO) Take by mouth.   . Lancets (ONETOUCH DELICA PLUS [id number]) MISC 1 Device by Does not apply route 2 (two) times a day.  .  losartan (COZAAR) 25 MG tablet Take 1 tablet (25 mg total) by mouth daily.  . metoprolol succinate (TOPROL-XL) 25 MG 24 hr tablet Take 1 tablet (25 mg total) by mouth daily.  . montelukast (SINGULAIR) 10 MG tablet TAKE 1 TABLET BY MOUTH EVERY NIGHT AT BEDTIME  . Multiple Vitamins-Iron (MULTI-VITAMIN/IRON PO) Take by mouth.   . nitroGLYCERIN (NITROSTAT) 0.4 MG SL tablet Place 1 tablet (0.4 mg total) under the tongue every 5 (five) minutes as needed for chest pain (up to 3 doses).  . pantoprazole (PROTONIX) 40 MG tablet TAKE 1 TABLET BY MOUTH TWICE DAILY  . potassium chloride SA (K-DUR) 20 MEQ tablet Take 2 tablets (40 mEq total) by mouth daily.  . predniSONE (DELTASONE) 10 MG tablet Take 1 tablet (10 mg total) by mouth daily with breakfast.     Allergies:   Patient has no known allergies.   Social History   Socioeconomic History  . Marital status: Married    Spouse name: Not on file  . Number of children: Not on file  . Years of education: Not on file  . Highest education level: Not on file  Occupational History  . Occupation: retired    Comment: From [employer] x 49 years  Tobacco Use  . Smoking status: Never Smoker  . Smokeless tobacco: Never Used  Vaping Use  . Vaping Use: Never used  Substance and Sexual Activity  . Alcohol use: Yes    Comment: 2 beers a month  . Drug use: Never  . Sexual activity: Yes    Birth control/protection: None  Other Topics Concern  . Not on file  Social History Narrative  . Not on file   Social Determinants of Health   Financial Resource Strain:   . Difficulty of Paying Living Expenses: Not on file  Food Insecurity:   . Worried About Charity fundraiser in the Last Year: Not on file  . Ran Out of Food in the Last Year: Not on file  Transportation Needs:   . Lack of Transportation (Medical): Not on file  . Lack of Transportation (Non-Medical): Not on file  Physical Activity: Inactive  . Days of Exercise per Week: 0 days  . Minutes of Exercise  per Session: 0 min  Stress:   . Feeling of Stress : Not on file  Social Connections:   . Frequency of Communication with Friends and Family: Not on file  . Frequency of Social Gatherings with Friends and Family: Not on file  . Attends Religious Services: Not on file  . Active Member of Clubs or Organizations: Not on file  . Attends Archivist Meetings: Not on file  . Marital Status: Not on file     Family History: The patient's He was  adopted. Family history is unknown by patient.  ROS:   Please see the history of present illness.     All other systems reviewed and are negative.  EKGs/Labs/Other Studies Reviewed:    The following studies were reviewed today:  Echocardiogram dated 01/11/2019 1. Left ventricular ejection fraction, by visual estimation, is 25 to 30%. The left ventricle has severely decreased function. There is borderline left ventricular hypertrophy.  2. Left ventricular diastolic parameters are indeterminate.  3. Mildly dilated left ventricular internal cavity size.  4. The left ventricle demonstrates global hypokinesis.  5. Global right ventricle has mildly reduced systolic function.The right ventricular size is normal. No increase in right ventricular wall thickness.  6. Left atrial size was mildly dilated.  7. Moderately elevated pulmonary artery systolic pressure.  8. The inferior vena cava is dilated in size with >50% respiratory variability, suggesting right atrial pressure of 8 mmHg.   EKG:  EKG is ordered today .  EKG shows sinus rhythm with frequent PACs.  Recent Labs: 03/24/2019: ALT 13; Hemoglobin 13.1; Platelets 130; Potassium 4.8; Sodium 141 10/14/2019: BUN 29; Creatinine, Ser 1.75  Recent Lipid Panel No results found for: CHOL, TRIG, HDL, CHOLHDL, VLDL, LDLCALC, LDLDIRECT  Physical Exam:    VS:  BP 120/60 (BP Location: Left Arm, Patient Position: Sitting, Cuff Size: Normal)   Pulse 76   Ht 6\' 1"  (1.854 m)   Wt 209 lb 8 oz (95 kg)    SpO2 98%   BMI 27.64 kg/m     Wt Readings from Last 3 Encounters:  01/13/20 209 lb 8 oz (95 kg)  12/09/19 204 lb (92.5 kg)  10/16/19 215 lb (97.5 kg)     GEN:  Well nourished, well developed in no acute distress HEENT: Normal NECK: No JVD; No carotid bruits LYMPHATICS: No lymphadenopathy CARDIAC: Regular rate rhythm, occasional skipped beats, no murmurs, rubs, gallops RESPIRATORY:  Clear to auscultation without rales, wheezing or rhonchi  ABDOMEN: Soft, non-tender, non-distended MUSCULOSKELETAL:  No edema; No deformity  SKIN: Warm and dry NEUROLOGIC:  Alert and oriented x 3 PSYCHIATRIC:  Normal affect   ASSESSMENT:    1. Heart failure with reduced ejection fraction ([place])   2. Coronary artery disease involving coronary bypass graft of native heart without angina pectoris   3. PAF (paroxysmal atrial fibrillation) (HCC)    PLAN:    In order of problems listed above:  1. ischemic cardiomyopathy, last echocardiogram with EF 30 to 35% keep appointment with EP for ICD consideration.  ECG with right bundle branch block.  CRT-D being considered.  Patient is euvolemic.  BP improved after stopping Entresto.  Continue Toprol-XL 25 mg daily, losartan 25 mg daily. 2. History of CAD/CABG x3 in 2019.  Patient currently asymptomatic. continue Lipitor daily. on Eliquis.  3. History of paroxysmal A. fib, chadsvasc score of 5 (chf, dm, age,cad).  Heart rate controlled.  Continue Toprol, Eliquis 5 mg twice daily.    Follow-up in 3 months  Total encounter time 40 minutes  Greater than 50% was spent in counseling and coordination of care with the patient    This note was generated in part or whole with voice recognition software. Voice recognition is usually quite accurate but there are transcription errors that can and very often do occur. I apologize for any typographical errors that were not detected and corrected.  Medication Adjustments/Labs and Tests Ordered: Current medicines are  reviewed at length with the patient today.  Concerns regarding medicines are outlined above.  Orders Placed This Encounter  Procedures  . EKG 12-Lead   No orders of the defined types were placed in this encounter.   Patient Instructions  Medication Instructions:  Your physician recommends that you continue on your current medications as directed. Please refer to the Current Medication list given to you today.  *If you need a refill on your cardiac medications before your next appointment, please call your pharmacy*   Lab Work: None Ordered If you have labs (blood work) drawn today and your tests are completely normal, you will receive your results only by: [name] MyChart Message (if you have MyChart) OR . A paper copy in the mail If you have any lab test that is abnormal or we need to change your treatment, we will call you to review the results.   Testing/Procedures: None Ordered   Follow-Up: At [facility], you and your health needs are our priority.  As part of our continuing mission to provide you with exceptional heart care, we have created designated Provider Care Teams.  These Care Teams include your primary Cardiologist (physician) and Advanced Practice Providers (APPs -  Physician Assistants and Nurse Practitioners) who all work together to provide you with the care you need, when you need it.  We recommend signing up for the patient portal called "MyChart".  Sign up information is provided on this After Visit Summary.  MyChart is used to connect with patients for Virtual Visits (Telemedicine).  Patients are able to view lab/test results, encounter notes, upcoming appointments, etc.  Non-urgent messages can be sent to your provider as well.   To learn more about what you can do with MyChart, go to [email or web address].    Your next appointment:   3 month(s)  The format for your next appointment:   In Person  Provider:   Kate Sable, MD   Other  Instructions      Signed, Kate Sable, MD  01/13/2020 12:36 PM    [place]

## 2020-02-05 ENCOUNTER — Other Ambulatory Visit: Payer: Self-pay

## 2020-02-05 ENCOUNTER — Ambulatory Visit (INDEPENDENT_AMBULATORY_CARE_PROVIDER_SITE_OTHER): Payer: Medicare Other | Admitting: Cardiology

## 2020-02-05 ENCOUNTER — Encounter: Payer: Self-pay | Admitting: Cardiology

## 2020-02-05 VITALS — BP 100/60 | HR 88 | Ht 73.0 in | Wt 211.0 lb

## 2020-02-05 DIAGNOSIS — I5042 Chronic combined systolic (congestive) and diastolic (congestive) heart failure: Secondary | ICD-10-CM

## 2020-02-05 DIAGNOSIS — I4510 Unspecified right bundle-branch block: Secondary | ICD-10-CM | POA: Diagnosis not present

## 2020-02-05 DIAGNOSIS — I480 Paroxysmal atrial fibrillation: Secondary | ICD-10-CM

## 2020-02-05 NOTE — Progress Notes (Signed)
Electrophysiology Office Follow up Visit Note:    Date:  02/05/2020   ID:  Joshua Roth, DOB 1940-06-01, MRN [id number]  PCP:  Leonel Ramsay, MD  [facility] Cardiologist:  Kate Sable, MD  [place] Electrophysiologist:  None    Interval History:    Joshua Roth is a 79 y.o. male who presents for a follow up visit. They were last seen in clinic October 16, 2019.  At that appointment an ICD was discussed with the patient and recommendation made for CRT-D implant.  He was hesitant about scheduling and wanted to talk it over with his family.  Since that time he has had a hospitalization for Covid pneumonia at [facility].  He was unvaccinated at the time of his diagnosis.  From reviewing the chart it looks like he was fairly ill with this and even discussed hospice care. Since that time he has improved.  He is continue to have some low blood pressures so his Delene Loll was stopped at a recent appointment with Dr. [name] and losartan was started in its place.  He tells me since his medications have been changed he feels better.  He did have a fall at home after his most recent visit with Dr. [name].  He tripped on a stair and fell on his left rib cage.  He had rib pain for 2 weeks but that has since improved.  Given his multiple medical comorbidities, ongoing cancer care, the patient and his family are hesitant to proceed with CRT-D implant.     Past Medical History:  Diagnosis Date  . A-fib ([place])   . Anemia   . Asthma   . COPD (chronic obstructive pulmonary disease) ([place])   . Coronary artery disease   . Diabetes mellitus without complication ([place])   . GERD (gastroesophageal reflux disease)   . Heart disease   . Hyperlipidemia     Past Surgical History:  Procedure Laterality Date  . CARDIAC CATHETERIZATION    . CORONARY ARTERY BYPASS GRAFT     cabg x 3 in 2019 at [facility]  . LOWER EXTREMITY ANGIOGRAPHY Right 10/14/2019   Procedure:  LOWER EXTREMITY ANGIOGRAPHY;  Surgeon: Algernon Huxley, MD;  Location: [place] [facility];  Service: Cardiovascular;  Laterality: Right;  . NECK SURGERY    . REPLACEMENT TOTAL HIP W/  RESURFACING IMPLANTS  2010   Left, Right   . WRIST FRACTURE SURGERY Left     Current Medications: Current Meds  Medication Sig  . acetaminophen (TYLENOL) 500 MG tablet Take by mouth.  [name] albuterol (VENTOLIN HFA) 108 (90 Base) MCG/ACT inhaler Inhale 2 puffs into the lungs every 6 (six) hours as needed for wheezing or shortness of breath.  [name] apixaban (ELIQUIS) 5 MG TABS tablet Take 1 tablet (5 mg total) by mouth 2 (two) times daily.  [name] aspirin EC 81 MG tablet Take 1 tablet (81 mg total) by mouth daily.  [name] atorvastatin (LIPITOR) 80 MG tablet TAKE 1 TABLET BY MOUTH EVERY DAY  . bicalutamide (CASODEX) 50 MG tablet Take 50 mg by mouth daily.   . Calcium Carbonate-Vit D-Min (CALCIUM 600+D PLUS MINERALS) 600-400 MG-UNIT TABS Take by mouth daily in the afternoon.  . canagliflozin (INVOKANA) 300 MG TABS tablet Take 300 mg by mouth in the morning and at bedtime.   . diclofenac sodium (VOLTAREN) 1 % GEL Apply 2 g topically as needed.  . ferrous sulfate 325 (65 FE) MG EC tablet Take by mouth daily in the  afternoon.  . fluorouracil (EFUDEX) 5 % cream Apply topically 2 (two) times daily.  . Fluticasone-Salmeterol (WIXELA INHUB) 500-50 MCG/DOSE AEPB Inhale 1 puff into the lungs 2 (two) times daily.  . furosemide (LASIX) 20 MG tablet Take 20 mg by mouth as needed.  [name] glipiZIDE (GLUCOTROL) 5 MG tablet Take 5 mg by mouth in the morning and at bedtime. 5mg  am, 2.5 pm  . glucose blood (ONETOUCH VERIO) test strip Use as instructed  . Iron-Vitamins (GERITOL PO) Take by mouth.   . Lancets (ONETOUCH DELICA PLUS [id number]) MISC 1 Device by Does not apply route 2 (two) times a day.  . losartan (COZAAR) 25 MG tablet Take 1 tablet (25 mg total) by mouth daily.  . metoprolol succinate (TOPROL-XL) 25 MG 24 hr tablet Take 1 tablet (25 mg  total) by mouth daily.  . montelukast (SINGULAIR) 10 MG tablet TAKE 1 TABLET BY MOUTH EVERY NIGHT AT BEDTIME  . Multiple Vitamins-Iron (MULTI-VITAMIN/IRON PO) Take by mouth.   . nitroGLYCERIN (NITROSTAT) 0.4 MG SL tablet Place 1 tablet (0.4 mg total) under the tongue every 5 (five) minutes as needed for chest pain (up to 3 doses).  . pantoprazole (PROTONIX) 40 MG tablet TAKE 1 TABLET BY MOUTH TWICE DAILY  . potassium chloride SA (K-DUR) 20 MEQ tablet Take 2 tablets (40 mEq total) by mouth daily.  . predniSONE (DELTASONE) 10 MG tablet Take 1 tablet (10 mg total) by mouth daily with breakfast.  . [DISCONTINUED] Calcium Carbonate-Vitamin D 600-400 MG-UNIT tablet Take by mouth.   . [DISCONTINUED] glipiZIDE (GLUCOTROL) 5 MG tablet Take 1 tablet (5 mg total) by mouth 2 (two) times daily before a meal. (Patient taking differently: Take by mouth. Takes 5 mg am and 2.5 mg pm daily.)     Allergies:   Patient has no known allergies.   Social History   Socioeconomic History  . Marital status: Married    Spouse name: Not on file  . Number of children: Not on file  . Years of education: Not on file  . Highest education level: Not on file  Occupational History  . Occupation: retired    Comment: From [employer] x 49 years  Tobacco Use  . Smoking status: Never Smoker  . Smokeless tobacco: Never Used  Vaping Use  . Vaping Use: Never used  Substance and Sexual Activity  . Alcohol use: Yes    Comment: 2 beers a month  . Drug use: Never  . Sexual activity: Yes    Birth control/protection: None  Other Topics Concern  . Not on file  Social History Narrative  . Not on file   Social Determinants of Health   Financial Resource Strain:   . Difficulty of Paying Living Expenses: Not on file  Food Insecurity:   . Worried About Charity fundraiser in the Last Year: Not on file  . Ran Out of Food in the Last Year: Not on file  Transportation Needs:   . Lack of Transportation (Medical): Not on file  . Lack  of Transportation (Non-Medical): Not on file  Physical Activity: Inactive  . Days of Exercise per Week: 0 days  . Minutes of Exercise per Session: 0 min  Stress:   . Feeling of Stress : Not on file  Social Connections:   . Frequency of Communication with Friends and Family: Not on file  . Frequency of Social Gatherings with Friends and Family: Not on file  . Attends Religious Services: Not on file  .  Active Member of Clubs or Organizations: Not on file  . Attends Archivist Meetings: Not on file  . Marital Status: Not on file     Family History: The patient's He was adopted. Family history is unknown by patient.  ROS:   Please see the history of present illness.    All other systems reviewed and are negative.  EKGs/Labs/Other Studies Reviewed:    The following studies were reviewed today: Prior notes and EKGs  January 13, 2020 EKG personally reviewed shows a right bundle branch block with a QRS duration of approximately 160 ms, first-degree AV delay and a left anterior fascicular block.  EKG:  The ekg ordered today demonstrates sinus rhythm with PACs.  Right bundle branch block pattern with a QRS duration of approximately 160 ms.  Recent Labs: 03/24/2019: ALT 13; Hemoglobin 13.1; Platelets 130; Potassium 4.8; Sodium 141 10/14/2019: BUN 29; Creatinine, Ser 1.75  Recent Lipid Panel No results found for: CHOL, TRIG, HDL, CHOLHDL, VLDL, LDLCALC, LDLDIRECT  Physical Exam:    VS:  BP 100/60   Pulse 88   Ht 6\' 1"  (1.854 m)   Wt 211 lb (95.7 kg)   SpO2 97%   BMI 27.84 kg/m     Wt Readings from Last 3 Encounters:  02/05/20 211 lb (95.7 kg)  01/13/20 209 lb 8 oz (95 kg)  12/09/19 204 lb (92.5 kg)     GEN:  Well nourished, well developed in no acute distress.  Chronically ill appearing.  Using walker. HEENT: Normal NECK: No JVD; No carotid bruits LYMPHATICS: No lymphadenopathy CARDIAC: RRR, no murmurs, rubs, gallops RESPIRATORY:  Clear to auscultation without  rales, wheezing or rhonchi  ABDOMEN: Soft, non-tender, non-distended MUSCULOSKELETAL:  No edema; No deformity  SKIN: Warm and dry NEUROLOGIC:  Alert and oriented x 3 PSYCHIATRIC:  Normal affect   ASSESSMENT:    1. Chronic combined systolic and diastolic heart failure ([place])   2. Right bundle branch block   3. PAF (paroxysmal atrial fibrillation) (H[facility])    PLAN:    In order of problems listed above:  1. Chronic combined systolic and diastolic heart failure Last ejection fraction was measured as 30 to 35%.  He is NYHA class III.  He is on good medical therapy.  Had to be changed from Entresto to losartan given some hypotension. The patient is a candidate for CRT given his wide right bundle branch block on EKG, NYHA class III symptoms and persistently low ejection fraction.  The decision is not clear-cut though given his multiple medical comorbidities and advanced age.  The patient and his family are very hesitant to undergo any additional cardiac procedures given his recent hospitalization, ongoing cancer care, chronic steroid use and other medical comorbidities.  I think these are very reasonable concerns from the family and patient.  I think given his advanced age and medical comorbidities and recent prolonged hospitalization with Covid pneumonia, he is not an ideal candidate for device implant.  We have decided to not pursue ICD or CRT-D implant at this time.  He will continue to follow with Dr. [name] for his other cardiovascular comorbidities.  2.  Paroxysmal atrial fibrillation In sinus rhythm today.  Continue Eliquis for stroke prophylaxis.       Medication Adjustments/Labs and Tests Ordered: Current medicines are reviewed at length with the patient today.  Concerns regarding medicines are outlined above.  Orders Placed This Encounter  Procedures  . EKG 12-Lead   No orders of the defined types  were placed in this encounter.    Signed, Lars Mage, MD, [facility]    02/05/2020 10:38 AM    Electrophysiology [facility]

## 2020-02-05 NOTE — Patient Instructions (Signed)
Medication Instructions:  Your physician recommends that you continue on your current medications as directed. Please refer to the Current Medication list given to you today.  *If you need a refill on your cardiac medications before your next appointment, please call your pharmacy*   Lab Work: None ordered   Testing/Procedures: None ordered   Follow-Up: At [facility], you and your health needs are our priority.  As part of our continuing mission to provide you with exceptional heart care, we have created designated Provider Care Teams.  These Care Teams include your primary Cardiologist (physician) and Advanced Practice Providers (APPs -  Physician Assistants and Nurse Practitioners) who all work together to provide you with the care you need, when you need it.  Your next appointment:   as  needed  The format for your next appointment:   In Person  Provider:   Cameron Lambert, MD    Thank you for choosing [facility]!!     

## 2020-04-16 ENCOUNTER — Ambulatory Visit (INDEPENDENT_AMBULATORY_CARE_PROVIDER_SITE_OTHER): Payer: Medicare Other | Admitting: Cardiology

## 2020-04-16 ENCOUNTER — Other Ambulatory Visit: Payer: Self-pay

## 2020-04-16 ENCOUNTER — Encounter: Payer: Self-pay | Admitting: Cardiology

## 2020-04-16 VITALS — BP 112/50 | HR 87 | Wt 214.4 lb

## 2020-04-16 DIAGNOSIS — I5020 Unspecified systolic (congestive) heart failure: Secondary | ICD-10-CM

## 2020-04-16 DIAGNOSIS — I480 Paroxysmal atrial fibrillation: Secondary | ICD-10-CM

## 2020-04-16 DIAGNOSIS — I25810 Atherosclerosis of coronary artery bypass graft(s) without angina pectoris: Secondary | ICD-10-CM | POA: Diagnosis not present

## 2020-04-16 DIAGNOSIS — R600 Localized edema: Secondary | ICD-10-CM

## 2020-04-16 MED ORDER — POTASSIUM CHLORIDE CRYS ER 20 MEQ PO TBCR: 20.0000 meq | EXTENDED_RELEASE_TABLET | ORAL | 1 refills | Status: AC | PRN

## 2020-04-16 NOTE — Progress Notes (Signed)
Cardiology Office Note:    Date:  04/16/2020   ID:  Joshua Roth, DOB [birth year], MRN [id number]  PCP:  Leonel Ramsay, MD  Cardiologist:  Kate Sable, MD  Electrophysiologist:  None   Referring MD: Leonel Ramsay, MD   Chief Complaint  Patient presents with  . Other    3 month f/u. Meds reviewed verbally with pt.    History of Present Illness:    Joshua Roth is a 80 y.o. male with a hx of asthma, prostate cancer with mets to bone, COPD, parox A. fib, CAD/CABG x3 in 2019, ICM EF 25-30%, PAD who presents for follow-up.    Being seen for ischemic cardiomyopathy, EF 30%.  Blood pressures were running low, patient had dizzy spells. Delene Loll was stopped after last visit, losartan started with improvement in blood pressure and dizzy spells. Followed up with EP regarding ICD consideration. Due to recent Covid pneumonia and hospitalization, including comorbidities, patient and family do not want to go through additional invasive procedure.  He feels okay, denies edema, really takes Lasix. [name]  Historical notes echocardiogram on 01/2019 showed severely reduced ejection fraction with EF 25 to 30%. In 2017, he was diagnosed with atrial fibrillation and started on Eliquis.  He is on chronic steroid use for his asthma/COPD symptoms.  He has an albuterol rescue inhaler which he rarely uses due to lack of symptoms.    Past Medical History:  Diagnosis Date  . A-fib ([place])   . Anemia   . Asthma   . COPD (chronic obstructive pulmonary disease) ([place])   . Coronary artery disease   . Diabetes mellitus without complication ([place])   . GERD (gastroesophageal reflux disease)   . Heart disease   . Hyperlipidemia     Past Surgical History:  Procedure Laterality Date  . CARDIAC CATHETERIZATION    . CORONARY ARTERY BYPASS GRAFT     cabg x 3 in 2019 at [facility]  . LOWER EXTREMITY ANGIOGRAPHY Right 10/14/2019   Procedure: LOWER EXTREMITY ANGIOGRAPHY;  Surgeon: Algernon Huxley, MD;  Location: [place] [facility];  Service: Cardiovascular;  Laterality: Right;  . NECK SURGERY    . REPLACEMENT TOTAL HIP W/  RESURFACING IMPLANTS  2010   Left, Right   . WRIST FRACTURE SURGERY Left     Current Medications: Current Meds  Medication Sig  . acetaminophen (TYLENOL) 500 MG tablet Take by mouth.  [name] albuterol (VENTOLIN HFA) 108 (90 Base) MCG/ACT inhaler Inhale 2 puffs into the lungs every 6 (six) hours as needed for wheezing or shortness of breath.  [name] apixaban (ELIQUIS) 5 MG TABS tablet Take 1 tablet (5 mg total) by mouth 2 (two) times daily.  [name] atorvastatin (LIPITOR) 80 MG tablet TAKE 1 TABLET BY MOUTH EVERY DAY  . Calcium Carbonate-Vit D-Min (CALCIUM 600+D PLUS MINERALS) 600-400 MG-UNIT TABS Take by mouth daily in the afternoon.  . canagliflozin (INVOKANA) 300 MG TABS tablet Take 300 mg by mouth in the morning and at bedtime.   . diclofenac sodium (VOLTAREN) 1 % GEL Apply 2 g topically as needed.  . ferrous sulfate 325 (65 FE) MG EC tablet Take by mouth daily in the afternoon.  . fluorouracil (EFUDEX) 5 % cream Apply topically 2 (two) times daily.  . Fluticasone-Salmeterol (WIXELA INHUB) 500-50 MCG/DOSE AEPB Inhale 1 puff into the lungs 2 (two) times daily.  . furosemide (LASIX) 20 MG tablet Take 20 mg by mouth as needed.  [name] glipiZIDE (GLUCOTROL) 5 MG tablet Take  5 mg by mouth in the morning and at bedtime. 5mg  am, 2.5 pm  . glucose blood (ONETOUCH VERIO) test strip Use as instructed  . Iron-Vitamins (GERITOL PO) Take by mouth.   . Lancets (ONETOUCH DELICA PLUS [id number]) MISC 1 Device by Does not apply route 2 (two) times a day.  . losartan (COZAAR) 25 MG tablet Take 1 tablet (25 mg total) by mouth daily.  . metoprolol succinate (TOPROL-XL) 25 MG 24 hr tablet Take 1 tablet (25 mg total) by mouth daily.  . montelukast (SINGULAIR) 10 MG tablet TAKE 1 TABLET BY MOUTH EVERY NIGHT AT BEDTIME  . Multiple Vitamins-Iron (MULTI-VITAMIN/IRON PO) Take by mouth.   .  nitroGLYCERIN (NITROSTAT) 0.4 MG SL tablet Place 1 tablet (0.4 mg total) under the tongue every 5 (five) minutes as needed for chest pain (up to 3 doses).  . pantoprazole (PROTONIX) 40 MG tablet TAKE 1 TABLET BY MOUTH TWICE DAILY  . predniSONE (DELTASONE) 10 MG tablet Take 1 tablet (10 mg total) by mouth daily with breakfast.  . sacubitril-valsartan (ENTRESTO) 24-26 MG Take 1 tablet by mouth 2 (two) times daily.  . [DISCONTINUED] aspirin EC 81 MG tablet Take 1 tablet (81 mg total) by mouth daily.  . [DISCONTINUED] potassium chloride SA (K-DUR) 20 MEQ tablet Take 2 tablets (40 mEq total) by mouth daily.     Allergies:   Patient has no known allergies.   Social History   Socioeconomic History  . Marital status: Married    Spouse name: Not on file  . Number of children: Not on file  . Years of education: Not on file  . Highest education level: Not on file  Occupational History  . Occupation: retired    Comment: From [employer] x 49 years  Tobacco Use  . Smoking status: Never Smoker  . Smokeless tobacco: Never Used  Vaping Use  . Vaping Use: Never used  Substance and Sexual Activity  . Alcohol use: Yes    Comment: 2 beers a month  . Drug use: Never  . Sexual activity: Yes    Birth control/protection: None  Other Topics Concern  . Not on file  Social History Narrative  . Not on file   Social Determinants of Health   Financial Resource Strain: Not on file  Food Insecurity: Not on file  Transportation Needs: Not on file  Physical Activity: Inactive  . Days of Exercise per Week: 0 days  . Minutes of Exercise per Session: 0 min  Stress: Not on file  Social Connections: Not on file     Family History: The patient's He was adopted. Family history is unknown by patient.  ROS:   Please see the history of present illness.     All other systems reviewed and are negative.  EKGs/Labs/Other Studies Reviewed:    The following studies were reviewed today:  Echocardiogram dated  01/11/2019 1. Left ventricular ejection fraction, by visual estimation, is 25 to 30%. The left ventricle has severely decreased function. There is borderline left ventricular hypertrophy.  2. Left ventricular diastolic parameters are indeterminate.  3. Mildly dilated left ventricular internal cavity size.  4. The left ventricle demonstrates global hypokinesis.  5. Global right ventricle has mildly reduced systolic function.The right ventricular size is normal. No increase in right ventricular wall thickness.  6. Left atrial size was mildly dilated.  7. Moderately elevated pulmonary artery systolic pressure.  8. The inferior vena cava is dilated in size with >50% respiratory variability, suggesting right atrial pressure  of 8 mmHg.   EKG:  EKG is ordered today .  EKG shows A. fib, heart rate 87  Recent Labs: 10/14/2019: BUN 29; Creatinine, Ser 1.75  Recent Lipid Panel No results found for: CHOL, TRIG, HDL, CHOLHDL, VLDL, LDLCALC, LDLDIRECT  Physical Exam:    VS:  BP (!) 112/50 (BP Location: Left Arm, Patient Position: Sitting, Cuff Size: Normal)   Pulse 87   Wt 214 lb 6 oz (97.2 kg)   SpO2 96%   BMI 28.28 kg/m     Wt Readings from Last 3 Encounters:  04/16/20 214 lb 6 oz (97.2 kg)  02/05/20 211 lb (95.7 kg)  01/13/20 209 lb 8 oz (95 kg)     GEN:  Well nourished, well developed in no acute distress HEENT: Normal NECK: No JVD; No carotid bruits LYMPHATICS: No lymphadenopathy CARDIAC: Irregular irregular RESPIRATORY:  Clear to auscultation without rales, wheezing or rhonchi  ABDOMEN: Soft, non-tender, non-distended MUSCULOSKELETAL:  No edema; No deformity  SKIN: Warm and dry NEUROLOGIC:  Alert and oriented x 3 PSYCHIATRIC:  Normal affect   ASSESSMENT:    1. Heart failure with reduced ejection fraction ([place])   2. Coronary artery disease involving coronary bypass graft of native heart without angina pectoris   3. PAF (paroxysmal atrial fibrillation) ([place])   4. Pedal edema     PLAN:    In order of problems listed above:  1. ischemic cardiomyopathy, last echocardiogram with EF 30 to 35% declined ICD due to comorbid conditions.[name]  ECG with right bundle branch block.  Patient is euvolemic.  BP low normal.  Continue Toprol-XL and losartan.  Takes Lasix as needed. 2. History of CAD/CABG x3 in 2019.  Patient currently asymptomatic. continue Lipitor daily, Eliquis.  3. History of paroxysmal A. fib, chadsvasc score of 5 (chf, dm, age,cad).  Currently in A. fib.  Asymptomatic.  Heart rate controlled.  Continue Toprol, Eliquis 5 mg twice daily.    Follow-up in 6 months  Total encounter time 40 minutes  Greater than 50% was spent in counseling and coordination of care with the patient    This note was generated in part or whole with voice recognition software. Voice recognition is usually quite accurate but there are transcription errors that can and very often do occur. I apologize for any typographical errors that were not detected and corrected.  Medication Adjustments/Labs and Tests Ordered: Current medicines are reviewed at length with the patient today.  Concerns regarding medicines are outlined above.  Orders Placed This Encounter  Procedures  . EKG 12-Lead   Meds ordered this encounter  Medications  . potassium chloride SA (KLOR-CON) 20 MEQ tablet    Sig: Take 1 tablet (20 mEq total) by mouth as needed. Only when you take your Lasix.    Dispense:  180 tablet    Refill:  1    Patient Instructions  Medication Instructions:   1.  Take Potassium 20 MEQ: one tablet by mouth only when you need to take your Lasix. 2.  STOP Taking your Aspirin.     Lab Work: None  If you have labs (blood work) drawn today and your tests are completely normal, you will receive your results only by: [name] MyChart Message (if you have MyChart) OR . A paper copy in the mail If you have any lab test that is abnormal or we need to change your treatment, we will call you to  review the results.   Testing/Procedures: None ordered   Follow-Up: At  [facility], you and your health needs are our priority.  As part of our continuing mission to provide you with exceptional heart care, we have created designated Provider Care Teams.  These Care Teams include your primary Cardiologist (physician) and Advanced Practice Providers (APPs -  Physician Assistants and Nurse Practitioners) who all work together to provide you with the care you need, when you need it.  We recommend signing up for the patient portal called "MyChart".  Sign up information is provided on this After Visit Summary.  MyChart is used to connect with patients for Virtual Visits (Telemedicine).  Patients are able to view lab/test results, encounter notes, upcoming appointments, etc.  Non-urgent messages can be sent to your provider as well.   To learn more about what you can do with MyChart, go to [email or web address].    Your next appointment:   6 month(s)  The format for your next appointment:   In Person  Provider:   Kate Sable, MD   Other Instructions      Signed, Kate Sable, MD  04/16/2020 12:57 PM    [place]

## 2020-04-16 NOTE — Patient Instructions (Signed)
Medication Instructions:   1.  Take Potassium 20 MEQ: one tablet by mouth only when you need to take your Lasix. 2.  STOP Taking your Aspirin.     Lab Work: None  If you have labs (blood work) drawn today and your tests are completely normal, you will receive your results only by: [name] MyChart Message (if you have MyChart) OR . A paper copy in the mail If you have any lab test that is abnormal or we need to change your treatment, we will call you to review the results.   Testing/Procedures: None ordered   Follow-Up: At [facility], you and your health needs are our priority.  As part of our continuing mission to provide you with exceptional heart care, we have created designated Provider Care Teams.  These Care Teams include your primary Cardiologist (physician) and Advanced Practice Providers (APPs -  Physician Assistants and Nurse Practitioners) who all work together to provide you with the care you need, when you need it.  We recommend signing up for the patient portal called "MyChart".  Sign up information is provided on this After Visit Summary.  MyChart is used to connect with patients for Virtual Visits (Telemedicine).  Patients are able to view lab/test results, encounter notes, upcoming appointments, etc.  Non-urgent messages can be sent to your provider as well.   To learn more about what you can do with MyChart, go to [email or web address].    Your next appointment:   6 month(s)  The format for your next appointment:   In Person  Provider:   Kate Sable, MD   Other Instructions

## 2020-05-04 ENCOUNTER — Telehealth: Payer: Self-pay

## 2020-05-04 NOTE — Telephone Encounter (Signed)
Do not refill. Joshua Roth was stopped on 12/09/19. See below note.   Notes from 12/09/19: BP remains on the low side. Continue Toprol-XL 25 mg daily, start losartan 25 mg daily, stop Entresto due to low blood pressures.

## 2020-05-04 NOTE — Addendum Note (Signed)
Addended by: Kavin Leech on: 05/04/2020 10:15 AM   Modules accepted: Orders

## 2020-05-04 NOTE — Telephone Encounter (Signed)
[facility] pharmacy requesting refill of Entresto 24-26mg  tablets. Per 04/16/20 AVS, patient also taking losartan 25 mg daily. Is patient to be taking both of these medications? Please advise. Thank you!

## 2020-05-08 ENCOUNTER — Other Ambulatory Visit: Payer: Self-pay

## 2020-05-08 MED ORDER — LOSARTAN POTASSIUM 25 MG PO TABS: 25.0000 mg | ORAL_TABLET | Freq: Every day | ORAL | 3 refills | Status: AC

## 2020-06-03 ENCOUNTER — Other Ambulatory Visit: Payer: Self-pay | Admitting: *Deleted

## 2020-06-03 MED ORDER — METOPROLOL SUCCINATE ER 25 MG PO TB24: 25.0000 mg | ORAL_TABLET | Freq: Every day | ORAL | 2 refills | Status: AC

## 2020-06-03 MED ORDER — APIXABAN 5 MG PO TABS: 5.0000 mg | ORAL_TABLET | Freq: Two times a day (BID) | ORAL | 2 refills | Status: AC

## 2020-06-03 NOTE — Telephone Encounter (Signed)
Pt's age 80, wt 97.2 kg, SCr 0.5, CrCl 54.9, last ov w/ BA 04/16/20.

## 2020-08-26 IMAGING — CR DG HIP (WITH OR WITHOUT PELVIS) 2-3V*L*
3 series · 3 of 3 positions shown · non-contrast
Comparison: None.

CLINICAL DATA: Left hip pain for 1 week

EXAM:
DG HIP (WITH OR WITHOUT PELVIS) 2-3V LEFT

[pelvis ap]
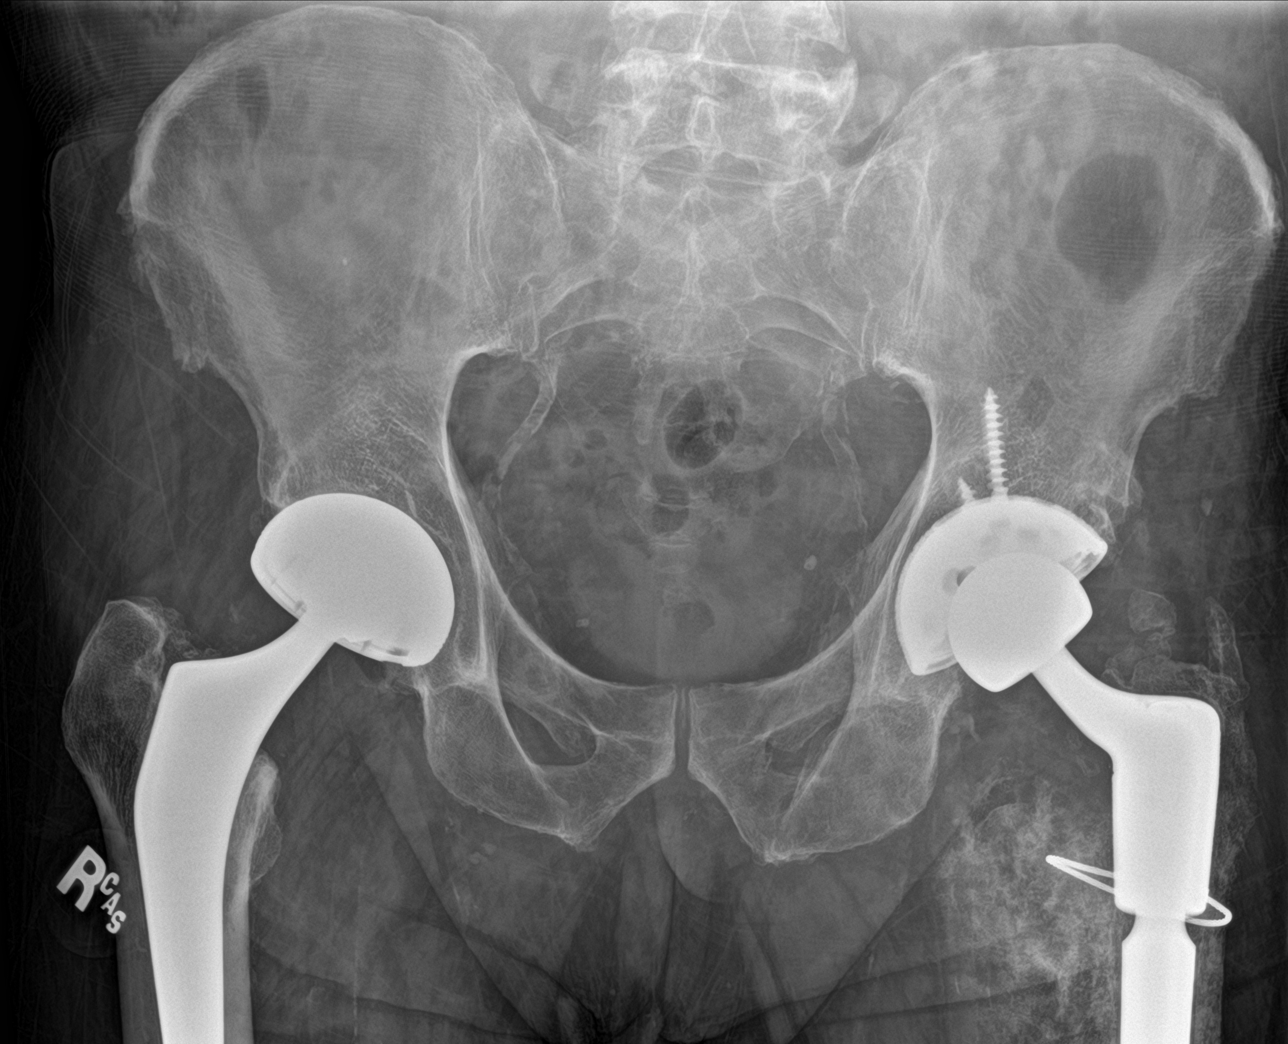

[hip ap]
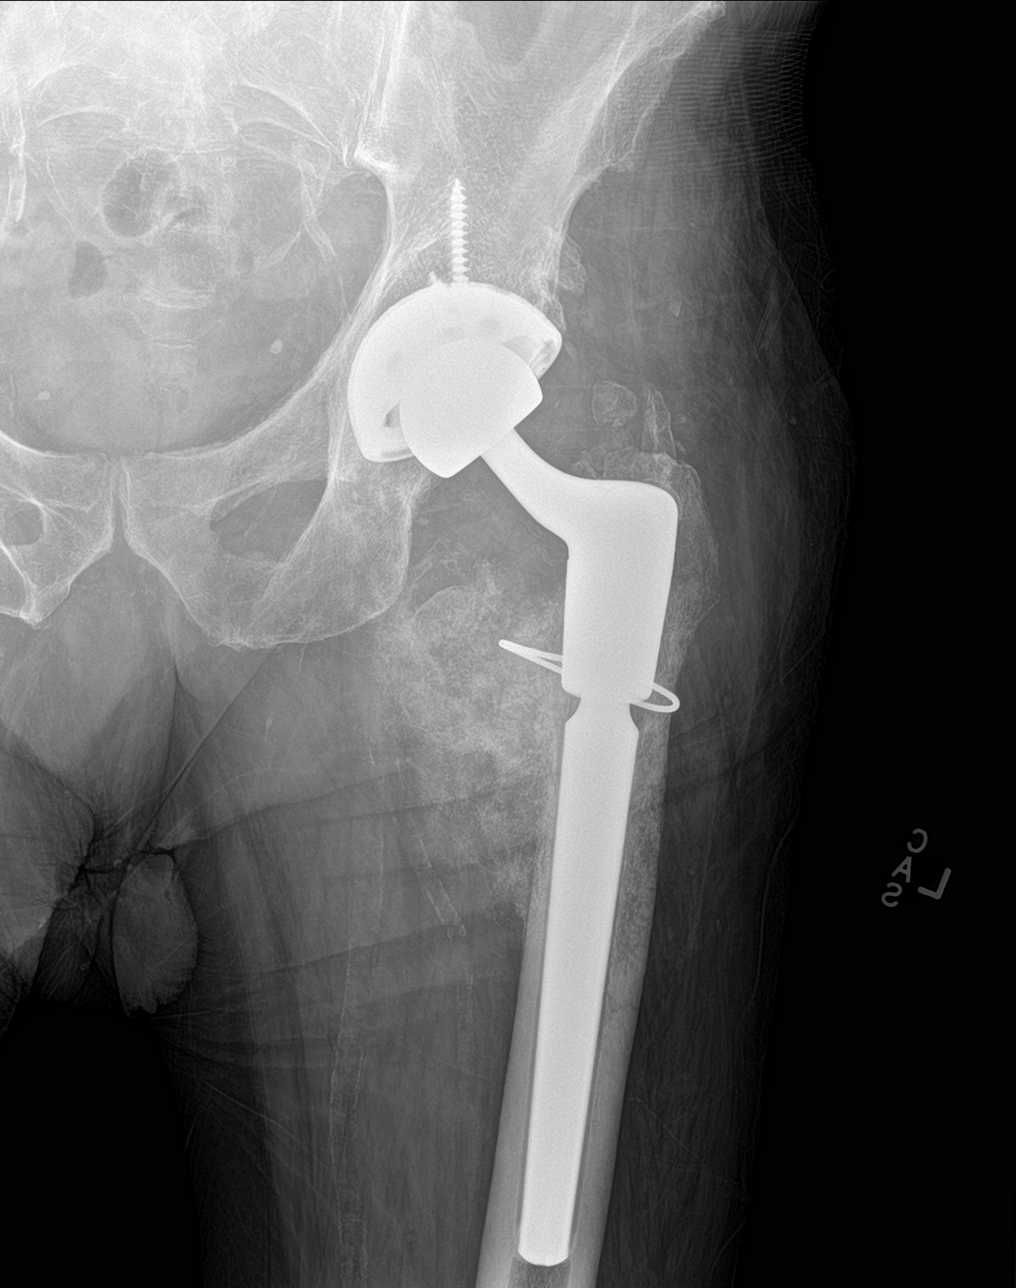

[hip lat]
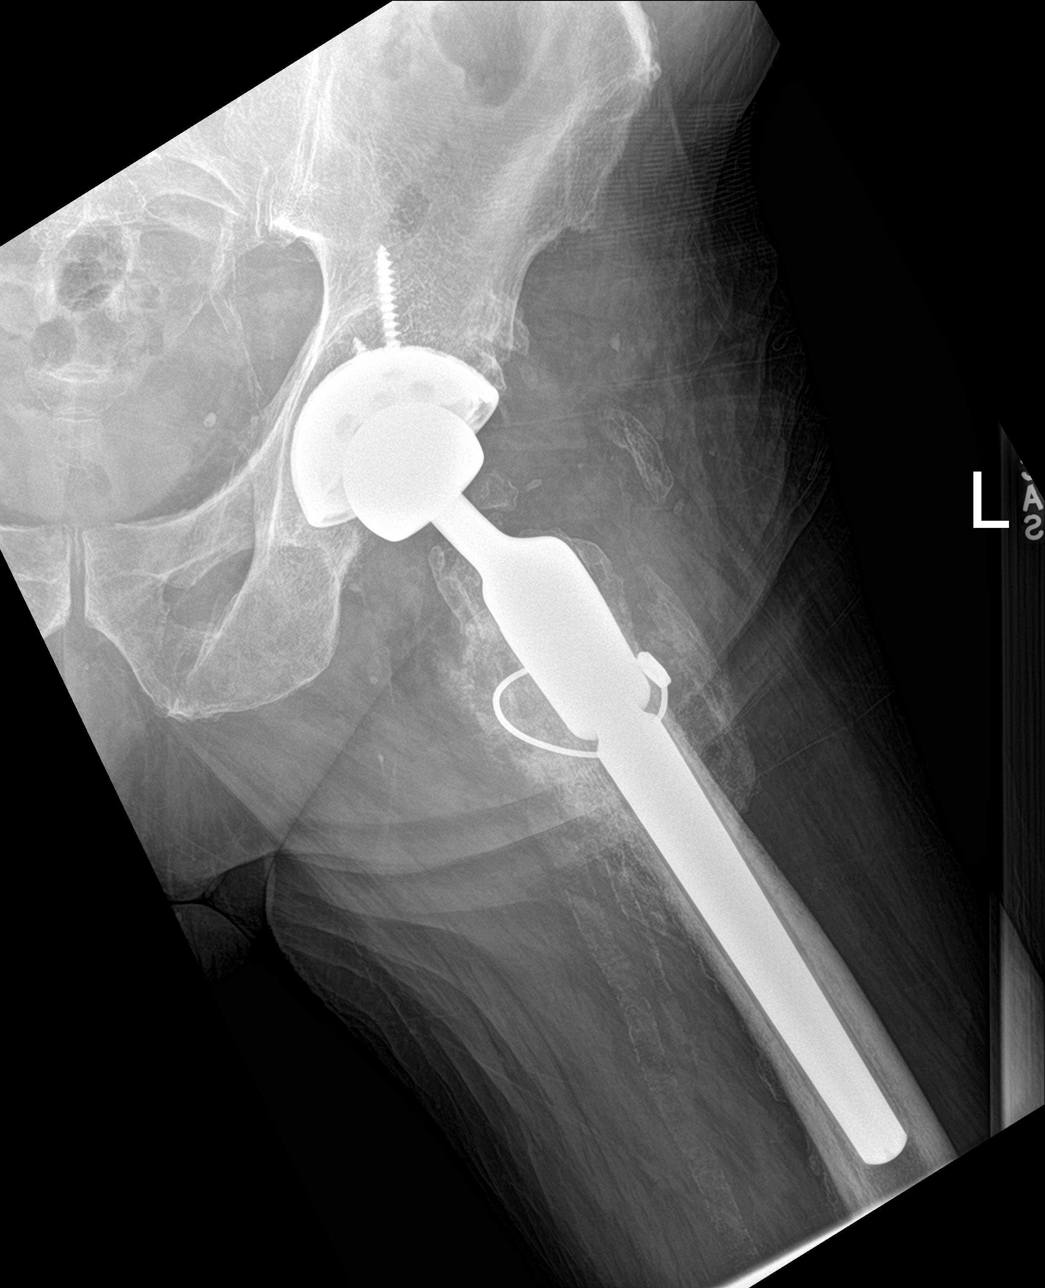

[3 of 3 positions shown; findings below may reference images not displayed]

FINDINGS: Patient is status post bilateral total hip arthroplasties. There is
extensive bone formation about the medial aspect of the proximal
left femoral diaphysis with bone formation beyond the previously
placed cerclage wire. There is a somewhat permeative appearance of
the cortex of the proximal femoral diaphysis. Transversely oriented
cortical lucency involving the lateral cortex at the level of the
mid to distal left femoral stem suspicious for a nondisplaced
fracture. Pelvic bony ring appears intact.
IMPRESSION: 1. Nondisplaced periprosthetic fracture at the lateral cortex of the
mid to distal left femoral stem.
2. Abnormal appearance of the proximal left femur with extensive
bone formation emanating from the medial cortex of the proximal left
femoral metadiaphysis. While these findings may reflect heterotopic
bone formation, the appearance is suspicious for periosteal bone
formation in the setting of a bone-forming malignancy. The lateral
cortex of the proximal left femoral diaphysis has a permeative
appearance. Although this could be secondary to focal
demineralization/osteopenia, changes related to chronic infection or
aggressive bone tumor are both considerations. Comparison with any
prior outside studies would be extremely valuable to assess for
interval changes. Orthopedic oncologic surgery consultation is
recommended.

These results were called by telephone at the time of interpretation
on 03/24/2019 at [DATE] to provider PAA STITH , who verbally
acknowledged these results.

## 2020-10-05 DEATH — deceased

## 2020-10-15 ENCOUNTER — Ambulatory Visit: Payer: Medicare Other | Admitting: Cardiology
